# Patient Record
Sex: Female | Born: 1985 | Race: Black or African American | Hispanic: No | Marital: Married | Smoking: Never smoker
Health system: Southern US, Community
[De-identification: ages and names within clinical notes are randomized; demographics above are authoritative.]

## PROBLEM LIST (undated history)

## (undated) ENCOUNTER — Inpatient Hospital Stay (HOSPITAL_COMMUNITY): Payer: Self-pay

## (undated) DIAGNOSIS — R7611 Nonspecific reaction to tuberculin skin test without active tuberculosis: Secondary | ICD-10-CM

## (undated) DIAGNOSIS — B019 Varicella without complication: Secondary | ICD-10-CM

## (undated) DIAGNOSIS — R519 Headache, unspecified: Secondary | ICD-10-CM

## (undated) HISTORY — DX: Nonspecific reaction to tuberculin skin test without active tuberculosis: R76.11

## (undated) HISTORY — DX: Varicella without complication: B01.9

## (undated) HISTORY — DX: Headache, unspecified: R51.9

---

## 2008-04-27 HISTORY — PX: WRIST SURGERY: SHX841

## 2016-04-16 ENCOUNTER — Encounter (HOSPITAL_COMMUNITY): Payer: Self-pay

## 2016-04-16 ENCOUNTER — Inpatient Hospital Stay (HOSPITAL_COMMUNITY)
Admission: AD | Admit: 2016-04-16 | Discharge: 2016-04-16 | Disposition: A | Discharge status: Home / Self Care | Payer: Medicaid Other | Source: Ambulatory Visit | Attending: Obstetrics & Gynecology | Admitting: Obstetrics & Gynecology

## 2016-04-16 DIAGNOSIS — R079 Chest pain, unspecified: Secondary | ICD-10-CM | POA: Diagnosis present

## 2016-04-16 DIAGNOSIS — R109 Unspecified abdominal pain: Secondary | ICD-10-CM | POA: Diagnosis present

## 2016-04-16 DIAGNOSIS — R12 Heartburn: Secondary | ICD-10-CM | POA: Diagnosis not present

## 2016-04-16 DIAGNOSIS — O219 Vomiting of pregnancy, unspecified: Secondary | ICD-10-CM | POA: Diagnosis not present

## 2016-04-16 DIAGNOSIS — R1110 Vomiting, unspecified: Secondary | ICD-10-CM | POA: Diagnosis present

## 2016-04-16 DIAGNOSIS — Z3A01 Less than 8 weeks gestation of pregnancy: Secondary | ICD-10-CM | POA: Diagnosis not present

## 2016-04-16 DIAGNOSIS — R112 Nausea with vomiting, unspecified: Secondary | ICD-10-CM | POA: Diagnosis not present

## 2016-04-16 DIAGNOSIS — O26891 Other specified pregnancy related conditions, first trimester: Secondary | ICD-10-CM | POA: Insufficient documentation

## 2016-04-16 LAB — POCT PREGNANCY, URINE: Preg Test, Ur: POSITIVE — AB

## 2016-04-16 LAB — URINALYSIS, ROUTINE W REFLEX MICROSCOPIC
Bilirubin Urine: NEGATIVE
Glucose, UA: NEGATIVE mg/dL
Hgb urine dipstick: NEGATIVE
KETONES UR: NEGATIVE mg/dL
LEUKOCYTES UA: NEGATIVE
NITRITE: NEGATIVE
PROTEIN: NEGATIVE mg/dL
Specific Gravity, Urine: 1.005 (ref 1.005–1.030)
pH: 6 (ref 5.0–8.0)

## 2016-04-16 MED ORDER — GI COCKTAIL ~~LOC~~
30.0000 mL | Freq: Once | ORAL | Status: AC
  Administered 2016-04-16: 30 mL via ORAL
  Filled 2016-04-16: qty 30

## 2016-04-16 MED ORDER — PROMETHAZINE HCL 25 MG PO TABS: 25.0000 mg | ORAL_TABLET | Freq: Four times a day (QID) | ORAL | 1 refills | Status: DC | PRN

## 2016-04-16 MED ORDER — PROMETHAZINE HCL 25 MG PO TABS
25.0000 mg | ORAL_TABLET | Freq: Once | ORAL | Status: AC
Start: 2016-04-16 — End: 2016-04-16
  Administered 2016-04-16: 25 mg via ORAL
  Filled 2016-04-16: qty 1

## 2016-04-16 NOTE — Discharge Instructions (Signed)
Heartburn During Pregnancy Heartburn is pain or discomfort in the throat or chest. It may cause a burning feeling. It happens when stomach acid moves up into the tube that carries food from your mouth to your stomach (esophagus). Heartburn is common during pregnancy. It usually goes away or gets better after giving birth. Follow these instructions at home: Eating and drinking  Do not drink alcohol while you are pregnant.  Figure out which foods and beverages make you feel worse, and avoid them.  Beverages that you may want to avoid include: ? Coffee and tea (with or without caffeine). ? Energy drinks and sports drinks. ? Bubbly (carbonated) drinks or sodas. ? Citrus fruit juices.  Foods that you may want to avoid include: ? Chocolate and cocoa. ? Peppermint and mint flavorings. ? Garlic, onions, and horseradish. ? Spicy and acidic foods. These include peppers, chili powder, curry powder, vinegar, hot sauces, and barbecue sauce. ? Citrus fruits, such as oranges, lemons, and limes. ? Tomato-based foods, such as red sauce, chili, and salsa. ? Fried and fatty foods, such as donuts, french fries, potato chips, and high-fat dressings. ? High-fat meats, such as hot dogs, cold cuts, sausage, ham, and bacon. ? High-fat dairy items, such as whole milk, butter, and cheese.  Eat small meals often, instead of large meals.  Avoid drinking a lot of liquid with your meals.  Avoid eating meals during the 2-3 hours before you go to bed.  Avoid lying down right after you eat.  Do not exercise right after you eat. Medicines  Take over-the-counter and prescription medicines only as told by your doctor.  Do not take aspirin, ibuprofen, or other NSAIDs unless your doctor tells you to do that.  Your doctor may tell you to avoid medicines that have sodium bicarbonate in them. General instructions  If told, raise the head of your bed about 6 inches (15 cm). You can do this by putting blocks under  the legs. Sleeping with more pillows does not help with heartburn.  Do not use any products that contain nicotine or tobacco, such as cigarettes and e-cigarettes. If you need help quitting, ask your doctor.  Wear loose-fitting clothing.  Try to lower your stress, such as with yoga or meditation. If you need help, ask your doctor.  Stay at a healthy weight. If you are overweight, work with your doctor to safely lose weight.  Keep all follow-up visits as told by your doctor. This is important. Contact a doctor if:  You get new symptoms.  Your symptoms do not get better with treatment.  You have weight loss and you do not know why.  You have trouble swallowing.  You make loud sounds when you breathe (wheeze).  You have a cough that does not go away.  You have heartburn often for more than 2 weeks.  You feel sick to your stomach (nauseous), and this does not get better with treatment.  You are throwing up (vomiting), and this does not get better with treatment.  You have pain in your belly (abdomen). Get help right away if:  You have very bad chest pain that spreads to your arm, neck, or jaw.  You feel sweaty, dizzy, or light-headed.  You have trouble breathing.  You have pain when swallowing.  You throw up and your throw-up looks like blood or coffee grounds.  Your poop (stool) is bloody or black. This information is not intended to replace advice given to you by your health care provider.   Make sure you discuss any questions you have with your health care provider. Document Released: 05/16/2010 Document Revised: 12/30/2015 Document Reviewed: 12/30/2015 Elsevier Interactive Patient Education  2017 Elsevier Inc.  

## 2016-04-16 NOTE — MAU Provider Note (Signed)
History    Patient Nichole Perkins is a 30 year old G3P2002 at 6 weeks 3 days by reliable LMP. She has recently arrived from [place] with her family.   CSN: [id number]  Arrival date and time: 04/16/16 1407   None     Chief Complaint  Patient presents with  . Chest Pain  . Abdominal Pain  . Emesis   Emesis   This is a new problem. The current episode started in the past 7 days. The problem occurs less than 2 times per day. The problem has been gradually worsening. The emesis has an appearance of bile and bright red blood. Associated symptoms include abdominal pain and chest pain. Pertinent negatives include no arthralgias, chills, coughing, diarrhea, dizziness, fever, headaches, myalgias, sweats, URI or weight loss. She has tried nothing for the symptoms.  Abdominal Pain  This is a chronic problem. The current episode started 1 to 4 weeks ago. The onset quality is gradual. The problem occurs intermittently. The problem has been unchanged. The pain is located in the epigastric region. The quality of the pain is burning, aching and cramping. Associated symptoms include nausea and vomiting. Pertinent negatives include no arthralgias, diarrhea, fever, headaches, myalgias or weight loss. Nothing aggravates the pain. The pain is relieved by nothing. She has tried nothing for the symptoms.  Abdominal pain seems to be made worse by clenching and vomiting.   OB History    Gravida Para Term Preterm AB Living   3         2   SAB TAB Ectopic Multiple Live Births                  History reviewed. No pertinent past medical history.  Past Surgical History:  Procedure Laterality Date  . CESAREAN SECTION      No family history on file.  Social History  Substance Use Topics  . Smoking status: Never Smoker  . Smokeless tobacco: Never Used  . Alcohol use No    Allergies: No Known Allergies  No prescriptions prior to admission.    Review of Systems  Constitutional: Negative for chills,  fever and weight loss.  HENT: Negative.   Eyes: Negative.   Respiratory: Negative for cough.   Cardiovascular: Positive for chest pain.  Gastrointestinal: Positive for abdominal pain, heartburn, nausea and vomiting. Negative for diarrhea.  Genitourinary: Negative.   Musculoskeletal: Negative for arthralgias and myalgias.  Neurological: Negative.  Negative for dizziness and headaches.  Endo/Heme/Allergies: Negative.    Physical Exam   Blood pressure 143/65, pulse 76, temperature 98.7 F (37.1 C), temperature source Oral, resp. rate 17, height 5\' 4"  (1.626 m), weight 186 lb 8 oz (84.6 kg), last menstrual period 03/02/2016, SpO2 100 %.  Physical Exam  Constitutional: She is oriented to person, place, and time. She appears well-developed.  HENT:  Head: Normocephalic.  Neck: Normal range of motion.  Respiratory: Effort normal. No respiratory distress. She has no wheezes. She has no rales. She exhibits no tenderness.  GI: Soft. Bowel sounds are normal. She exhibits no distension and no mass. There is no tenderness. There is no rebound and no guarding.  Musculoskeletal: Normal range of motion.  Neurological: She is alert and oriented to person, place, and time.  Skin: Skin is warm.    MAU Course  Procedures  MDM -EKG-normal sinus rhythm -GI Cocktail and phenergan Assessment and Plan    1. Heartburn during pregnancy in first trimester   G3P2002 at 6 weeks and 3  days by reliable LMP here with complains of nausea, vomiting, abdominal pain and heartburn.   2. Discharge home with comfort measures, educated about eating small meals and separating fluid consumption with food intake. Recommend zantac PRN for heartburn and Phenergan RX for nausea.  3. [facility]  Kathryn Lorraine Kooistra CNM 04/16/2016, 3:06 PM

## 2016-04-16 NOTE — MAU Note (Signed)
Pt c/o chest pain and upper back pain that started 4 days ago. Pt states the pain comes and goes and is sometimes worse than others. Pt states she is also having upper mid abdominal that started before the chest pain. Pt states she took a pregnancy test at home that was positive. Pt denies vaginal bleeding and discharge.

## 2016-07-08 ENCOUNTER — Encounter: Payer: Self-pay | Admitting: Obstetrics and Gynecology

## 2016-07-08 ENCOUNTER — Ambulatory Visit (INDEPENDENT_AMBULATORY_CARE_PROVIDER_SITE_OTHER): Payer: BLUE CROSS/BLUE SHIELD | Admitting: Obstetrics and Gynecology

## 2016-07-08 ENCOUNTER — Other Ambulatory Visit (HOSPITAL_COMMUNITY)
Admission: RE | Admit: 2016-07-08 | Discharge: 2016-07-08 | Disposition: A | Discharge status: Home / Self Care | Payer: BLUE CROSS/BLUE SHIELD | Source: Ambulatory Visit | Attending: Obstetrics and Gynecology | Admitting: Obstetrics and Gynecology

## 2016-07-08 DIAGNOSIS — Z1151 Encounter for screening for human papillomavirus (HPV): Secondary | ICD-10-CM | POA: Diagnosis present

## 2016-07-08 DIAGNOSIS — O0930 Supervision of pregnancy with insufficient antenatal care, unspecified trimester: Secondary | ICD-10-CM | POA: Insufficient documentation

## 2016-07-08 DIAGNOSIS — Z3490 Encounter for supervision of normal pregnancy, unspecified, unspecified trimester: Secondary | ICD-10-CM | POA: Insufficient documentation

## 2016-07-08 DIAGNOSIS — Z113 Encounter for screening for infections with a predominantly sexual mode of transmission: Secondary | ICD-10-CM

## 2016-07-08 DIAGNOSIS — Z01419 Encounter for gynecological examination (general) (routine) without abnormal findings: Secondary | ICD-10-CM | POA: Diagnosis present

## 2016-07-08 DIAGNOSIS — Z124 Encounter for screening for malignant neoplasm of cervix: Secondary | ICD-10-CM | POA: Diagnosis not present

## 2016-07-08 DIAGNOSIS — Z3482 Encounter for supervision of other normal pregnancy, second trimester: Secondary | ICD-10-CM

## 2016-07-08 DIAGNOSIS — O34219 Maternal care for unspecified type scar from previous cesarean delivery: Secondary | ICD-10-CM | POA: Insufficient documentation

## 2016-07-08 DIAGNOSIS — O0932 Supervision of pregnancy with insufficient antenatal care, second trimester: Secondary | ICD-10-CM

## 2016-07-08 LAB — POCT URINALYSIS DIP (DEVICE)
BILIRUBIN URINE: NEGATIVE
Glucose, UA: NEGATIVE mg/dL
HGB URINE DIPSTICK: NEGATIVE
KETONES UR: NEGATIVE mg/dL
Leukocytes, UA: NEGATIVE
NITRITE: NEGATIVE
PH: 6 (ref 5.0–8.0)
PROTEIN: NEGATIVE mg/dL
Specific Gravity, Urine: >=1.03 (ref 1.005–1.030)
Urobilinogen, UA: 0.2 mg/dL (ref 0.0–1.0)

## 2016-07-08 NOTE — Patient Instructions (Signed)
Contraception Choices Contraception (birth control) is the use of any methods or devices to prevent pregnancy. Below are some methods to help avoid pregnancy. Hormonal methods  Contraceptive implant. This is a thin, plastic tube containing progesterone hormone. It does not contain estrogen hormone. Your health care provider inserts the tube in the inner part of the upper arm. The tube can remain in place for up to 3 years. After 3 years, the implant must be removed. The implant prevents the ovaries from releasing an egg (ovulation), thickens the cervical mucus to prevent sperm from entering the uterus, and thins the lining of the inside of the uterus.  Progesterone-only injections. These injections are given every 3 months by your health care provider to prevent pregnancy. This synthetic progesterone hormone stops the ovaries from releasing eggs. It also thickens cervical mucus and changes the uterine lining. This makes it harder for sperm to survive in the uterus.  Birth control pills. These pills contain estrogen and progesterone hormone. They work by preventing the ovaries from releasing eggs (ovulation). They also cause the cervical mucus to thicken, preventing the sperm from entering the uterus. Birth control pills are prescribed by a health care provider.Birth control pills can also be used to treat heavy periods.  Minipill. This type of birth control pill contains only the progesterone hormone. They are taken every day of each month and must be prescribed by your health care provider.  Birth control patch. The patch contains hormones similar to those in birth control pills. It must be changed once a week and is prescribed by a health care provider.  Vaginal ring. The ring contains hormones similar to those in birth control pills. It is left in the vagina for 3 weeks, removed for 1 week, and then a new one is put back in place. The patient must be comfortable inserting and removing the ring from  the vagina.A health care provider's prescription is necessary.  Emergency contraception. Emergency contraceptives prevent pregnancy after unprotected sexual intercourse. This pill can be taken right after sex or up to 5 days after unprotected sex. It is most effective the sooner you take the pills after having sexual intercourse. Most emergency contraceptive pills are available without a prescription. Check with your pharmacist. Do not use emergency contraception as your only form of birth control. Barrier methods  Female condom. This is a thin sheath (latex or rubber) that is worn over the penis during sexual intercourse. It can be used with spermicide to increase effectiveness.  Female condom. This is a soft, loose-fitting sheath that is put into the vagina before sexual intercourse.  Diaphragm. This is a soft, latex, dome-shaped barrier that must be fitted by a health care provider. It is inserted into the vagina, along with a spermicidal jelly. It is inserted before intercourse. The diaphragm should be left in the vagina for 6 to 8 hours after intercourse.  Cervical cap. This is a round, soft, latex or plastic cup that fits over the cervix and must be fitted by a health care provider. The cap can be left in place for up to 48 hours after intercourse.  Sponge. This is a soft, circular piece of polyurethane foam. The sponge has spermicide in it. It is inserted into the vagina after wetting it and before sexual intercourse.  Spermicides. These are chemicals that kill or block sperm from entering the cervix and uterus. They come in the form of creams, jellies, suppositories, foam, or tablets. They do not require a prescription. They   are inserted into the vagina with an applicator before having sexual intercourse. The process must be repeated every time you have sexual intercourse. Intrauterine contraception  Intrauterine device (IUD). This is a T-shaped device that is put in a woman's uterus during  a menstrual period to prevent pregnancy. There are 2 types: ? Copper IUD. This type of IUD is wrapped in copper wire and is placed inside the uterus. Copper makes the uterus and fallopian tubes produce a fluid that kills sperm. It can stay in place for 10 years. ? Hormone IUD. This type of IUD contains the hormone progestin (synthetic progesterone). The hormone thickens the cervical mucus and prevents sperm from entering the uterus, and it also thins the uterine lining to prevent implantation of a fertilized egg. The hormone can weaken or kill the sperm that get into the uterus. It can stay in place for 3-5 years, depending on which type of IUD is used. Permanent methods of contraception  Female tubal ligation. This is when the woman's fallopian tubes are surgically sealed, tied, or blocked to prevent the egg from traveling to the uterus.  Hysteroscopic sterilization. This involves placing a small coil or insert into each fallopian tube. Your doctor uses a technique called hysteroscopy to do the procedure. The device causes scar tissue to form. This results in permanent blockage of the fallopian tubes, so the sperm cannot fertilize the egg. It takes about 3 months after the procedure for the tubes to become blocked. You must use another form of birth control for these 3 months.  Female sterilization. This is when the female has the tubes that carry sperm tied off (vasectomy).This blocks sperm from entering the vagina during sexual intercourse. After the procedure, the man can still ejaculate fluid (semen). Natural planning methods  Natural family planning. This is not having sexual intercourse or using a barrier method (condom, diaphragm, cervical cap) on days the woman could become pregnant.  Calendar method. This is keeping track of the length of each menstrual cycle and identifying when you are fertile.  Ovulation method. This is avoiding sexual intercourse during ovulation.  Symptothermal method.  This is avoiding sexual intercourse during ovulation, using a thermometer and ovulation symptoms.  Post-ovulation method. This is timing sexual intercourse after you have ovulated. Regardless of which type or method of contraception you choose, it is important that you use condoms to protect against the transmission of sexually transmitted infections (STIs). Talk with your health care provider about which form of contraception is most appropriate for you. This information is not intended to replace advice given to you by your health care provider. Make sure you discuss any questions you have with your health care provider. Document Released: 04/13/2005 Document Revised: 09/19/2015 Document Reviewed: 10/06/2012 Elsevier Interactive Patient Education  2017 Elsevier Inc.  

## 2016-07-08 NOTE — Progress Notes (Signed)
   PRENATAL VISIT NOTE  Subjective:  Nichole Perkins is a 31 y.o. [phone] at 7980w2d being seen today for her first prenatal care.  She is currently monitored for the following issues for this low-risk pregnancy and has Supervision of low-risk pregnancy; Late prenatal care affecting pregnancy, antepartum; and Previous cesarean section complicating pregnancy on her problem list. Patient here from [place]. Moved her last November. Speaks english.   Patient reports no complaints.  Contractions: Not present. Vag. Bleeding: None, Other.  Movement: Present. Denies leaking of fluid.   The following portions of the patient's history were reviewed and updated as appropriate: allergies, current medications, past family history, past medical history, past social history, past surgical history and problem list. Problem list updated.  Objective:   Vitals:   07/08/16 0927  BP: (!) 123/56  Pulse: 72  Weight: 186 lb 1.6 oz (84.4 kg)    Fetal Status: Fetal Heart Rate (bpm): 150   Movement: Present     General:  Alert, oriented and cooperative. Patient is in no acute distress.  Skin: Skin is warm and dry. No rash noted.   Cardiovascular: Normal heart rate noted  Respiratory: Normal respiratory effort, no problems with respiration noted  Abdomen: Soft, gravid, appropriate for gestational age. Pain/Pressure: Absent     Pelvic:  Cervical exam deferred        Extremities: Normal range of motion.  Edema: None  Mental Status: Normal mood and affect. Normal behavior. Normal judgment and thought content.   Assessment and Plan:  Pregnancy: [id number] at 3980w2d  1. Encounter for supervision of low-risk pregnancy, antepartum  - Prenatal Profile I - Hemoglobinopathy Evaluation - Cytology - PAP - Culture, OB Urine - US MFM OB COMP + 14 WK; Future - Hemoglobin A1C - AFP, Quad Screen - Cervicovaginal ancillary only  2. Late prenatal care affecting pregnancy, antepartum   3. Previous cesarean section complicating  pregnancy  - Cervicovaginal ancillary only - TOLAC requested   Initial labs drawn. Continue prenatal vitamins. Genetic Screening discussed, First trimester screen, Quad screen and NIPS: Quad screen requested. Ultrasound discussed Problem list reviewed and updated. The nature of [facility] with multiple MDs and other Advanced Practice Providers was explained to patient; also emphasized that residents, students are part of our team. Routine obstetric precautions reviewed.  Preterm labor symptoms and general obstetric precautions including but not limited to vaginal bleeding, contractions, leaking of fluid and fetal movement were reviewed in detail with the patient. Please refer to After Visit Summary for other counseling recommendations.  Return in about 4 weeks (around 08/05/2016).   Duane LopeJennifer I Benson Porcaro, NP

## 2016-07-08 NOTE — Progress Notes (Signed)
Here for initial prenatal visit. Needs 2 hr fasting gtt due to bmi )30. Has eaten today- will make appt for lab only for gtt when checks out today. States had spotting twice earlier in pregnancy- none since then. Declines flu vaccine.

## 2016-07-09 LAB — CERVICOVAGINAL ANCILLARY ONLY
BACTERIAL VAGINITIS: NEGATIVE
CHLAMYDIA, DNA PROBE: NEGATIVE
Candida vaginitis: POSITIVE — AB
NEISSERIA GONORRHEA: NEGATIVE
Trichomonas: NEGATIVE

## 2016-07-10 LAB — HEMOGLOBINOPATHY EVALUATION
FERRITIN: 68 ng/mL (ref 15–150)
HGB A2 QUANT: 2.5 % (ref 1.8–3.2)
HGB C: 0 %
Hgb A: 95.6 % — ABNORMAL LOW (ref 96.4–98.8)
Hgb F Quant: 1.9 % (ref 0.0–2.0)
Hgb S: 0 %
Hgb Solubility: NEGATIVE
Hgb Variant: 0 %

## 2016-07-10 LAB — HEMOGLOBIN A1C
Est. average glucose Bld gHb Est-mCnc: 100 mg/dL
HEMOGLOBIN A1C: 5.1 % (ref 4.8–5.6)

## 2016-07-10 LAB — PRENATAL PROFILE I(LABCORP)
ANTIBODY SCREEN: NEGATIVE
Basophils Absolute: 0 10*3/uL (ref 0.0–0.2)
Basos: 0 %
EOS (ABSOLUTE): 0.1 10*3/uL (ref 0.0–0.4)
EOS: 2 %
Hematocrit: 33.5 % — ABNORMAL LOW (ref 34.0–46.6)
Hemoglobin: 11 g/dL — ABNORMAL LOW (ref 11.1–15.9)
Hepatitis B Surface Ag: NEGATIVE
Immature Grans (Abs): 0 10*3/uL (ref 0.0–0.1)
Immature Granulocytes: 0 %
LYMPHS ABS: 1.4 10*3/uL (ref 0.7–3.1)
Lymphs: 26 %
MCH: 30.5 pg (ref 26.6–33.0)
MCHC: 32.8 g/dL (ref 31.5–35.7)
MCV: 93 fL (ref 79–97)
Monocytes Absolute: 0.3 10*3/uL (ref 0.1–0.9)
Monocytes: 6 %
NEUTROS ABS: 3.5 10*3/uL (ref 1.4–7.0)
Neutrophils: 66 %
PLATELETS: 205 10*3/uL (ref 150–379)
RBC: 3.61 x10E6/uL — AB (ref 3.77–5.28)
RDW: 14.2 % (ref 12.3–15.4)
RH TYPE: POSITIVE
RPR: NONREACTIVE
Rubella Antibodies, IGG: 1.48 index (ref >0.99)
WBC: 5.3 10*3/uL (ref 3.4–10.8)

## 2016-07-10 LAB — CYTOLOGY - PAP
Diagnosis: NEGATIVE
HPV: NOT DETECTED

## 2016-07-13 ENCOUNTER — Telehealth: Payer: Self-pay | Admitting: Obstetrics and Gynecology

## 2016-07-13 ENCOUNTER — Other Ambulatory Visit: Payer: BLUE CROSS/BLUE SHIELD

## 2016-07-13 DIAGNOSIS — Z3401 Encounter for supervision of normal first pregnancy, first trimester: Secondary | ICD-10-CM

## 2016-07-13 LAB — CULTURE, OB URINE

## 2016-07-13 LAB — URINE CULTURE, OB REFLEX

## 2016-07-13 MED ORDER — PRENATAL VITAMINS 28-0.8 MG PO TABS: 1.0000 | ORAL_TABLET | Freq: Every day | ORAL | 3 refills | Status: DC

## 2016-07-13 MED ORDER — TERCONAZOLE 0.4 % VA CREA: 1.0000 | TOPICAL_CREAM | Freq: Every day | VAGINAL | 0 refills | Status: DC

## 2016-07-13 NOTE — Telephone Encounter (Signed)
+   yeast vaginitis  Terazol called into pharmacy.

## 2016-07-14 LAB — GLUCOSE TOLERANCE, 2 HOURS W/ 1HR
GLUCOSE, 1 HOUR: 115 mg/dL (ref 65–179)
GLUCOSE, FASTING: 90 mg/dL (ref 65–91)
Glucose, 2 hour: 116 mg/dL (ref 65–152)

## 2016-07-15 ENCOUNTER — Ambulatory Visit (HOSPITAL_COMMUNITY)
Admission: RE | Admit: 2016-07-15 | Discharge: 2016-07-15 | Disposition: A | Discharge status: Home / Self Care | Payer: BLUE CROSS/BLUE SHIELD | Source: Ambulatory Visit | Attending: Obstetrics and Gynecology | Admitting: Obstetrics and Gynecology

## 2016-07-15 DIAGNOSIS — Z363 Encounter for antenatal screening for malformations: Secondary | ICD-10-CM | POA: Diagnosis not present

## 2016-07-15 DIAGNOSIS — Z3490 Encounter for supervision of normal pregnancy, unspecified, unspecified trimester: Secondary | ICD-10-CM

## 2016-07-15 DIAGNOSIS — Z3A19 19 weeks gestation of pregnancy: Secondary | ICD-10-CM | POA: Diagnosis not present

## 2016-07-15 DIAGNOSIS — O34211 Maternal care for low transverse scar from previous cesarean delivery: Secondary | ICD-10-CM | POA: Insufficient documentation

## 2016-07-21 LAB — AFP, QUAD SCREEN
DIA MOM VALUE: 1.51
DIA VALUE (EIA): 234.96 pg/mL
DSR (By Age)    1 IN: 485
DSR (Second Trimester) 1 IN: 2519
Gestational Age: 18.3 WEEKS
MSAFP Mom: 1.25
MSAFP: 53.8 ng/mL
MSHCG MOM: 1.39
MSHCG: 32080 m[IU]/mL
Maternal Age At EDD: 32.5 YEARS
Osb Risk: 10000
TEST RESULTS AFP: NEGATIVE
UE3 MOM: 1.46
UE3 VALUE: 1.94 ng/mL
Weight: 186 [lb_av]

## 2016-07-29 ENCOUNTER — Encounter: Payer: BLUE CROSS/BLUE SHIELD | Admitting: Obstetrics and Gynecology

## 2016-07-29 ENCOUNTER — Ambulatory Visit (INDEPENDENT_AMBULATORY_CARE_PROVIDER_SITE_OTHER): Payer: BLUE CROSS/BLUE SHIELD | Admitting: Obstetrics and Gynecology

## 2016-07-29 DIAGNOSIS — Z3490 Encounter for supervision of normal pregnancy, unspecified, unspecified trimester: Secondary | ICD-10-CM

## 2016-07-29 DIAGNOSIS — Z3483 Encounter for supervision of other normal pregnancy, third trimester: Secondary | ICD-10-CM

## 2016-07-29 DIAGNOSIS — O34219 Maternal care for unspecified type scar from previous cesarean delivery: Secondary | ICD-10-CM

## 2016-07-29 NOTE — Progress Notes (Signed)
   PRENATAL VISIT NOTE  Subjective:  Revia Nghiem is a 31 y.o. [phone] at [redacted]w[redacted]d being seen today for ongoing prenatal care.  She is currently monitored for the following issues for this low-risk pregnancy and has Supervision of low-risk pregnancy; Late prenatal care affecting pregnancy, antepartum; and Previous cesarean section complicating pregnancy on her problem list.  Patient reports no complaints.   . Vag. Bleeding: None.  Movement: Present. Denies leaking of fluid.   The following portions of the patient's history were reviewed and updated as appropriate: allergies, current medications, past family history, past medical history, past social history, past surgical history and problem list. Problem list updated.  Objective:   Vitals:   07/29/16 1115  BP: (!) 113/47  Pulse: 86  Weight: 191 lb 14.4 oz (87 kg)    Fetal Status: Fetal Heart Rate (bpm): 160   Movement: Present     General:  Alert, oriented and cooperative. Patient is in no acute distress.  Skin: Skin is warm and dry. No rash noted.   Cardiovascular: Normal heart rate noted  Respiratory: Normal respiratory effort, no problems with respiration noted  Abdomen: Soft, gravid, appropriate for gestational age. Pain/Pressure: Absent     Pelvic:  Cervical exam deferred        Extremities: Normal range of motion.  Edema: None  Mental Status: Normal mood and affect. Normal behavior. Normal judgment and thought content.   Assessment and Plan:  Pregnancy: [id number] at [redacted]w[redacted]d  1. Encounter for supervision of low-risk pregnancy, antepartum Reviewed PN lab and [place] results.    2. Previous Cesarean Section x2 Desires TOLAC> counseled and consent form given to review and bring next  Preterm labor symptoms and general obstetric precautions including but not limited to vaginal bleeding, contractions, leaking of fluid and fetal movement were reviewed in detail with the patient. Please refer to After Visit Summary for other counseling  recommendations.  Return in about 4 weeks (around 08/26/2016).   Danae Orleans, CNM

## 2016-07-29 NOTE — Patient Instructions (Signed)

## 2016-08-26 ENCOUNTER — Ambulatory Visit (INDEPENDENT_AMBULATORY_CARE_PROVIDER_SITE_OTHER): Payer: BLUE CROSS/BLUE SHIELD | Admitting: Student

## 2016-08-26 DIAGNOSIS — O34219 Maternal care for unspecified type scar from previous cesarean delivery: Secondary | ICD-10-CM

## 2016-08-26 DIAGNOSIS — Z3492 Encounter for supervision of normal pregnancy, unspecified, second trimester: Secondary | ICD-10-CM

## 2016-08-26 DIAGNOSIS — O34212 Maternal care for vertical scar from previous cesarean delivery: Secondary | ICD-10-CM

## 2016-08-26 DIAGNOSIS — Z3490 Encounter for supervision of normal pregnancy, unspecified, unspecified trimester: Secondary | ICD-10-CM

## 2016-08-26 NOTE — Patient Instructions (Signed)
AREA PEDIATRIC/FAMILY PRACTICE PHYSICIANS  [facility] [address], [address] [place], [place]  [address] Phone - [phone]   Fax - [phone]  [facility] [address] [place], [place] [address] Phone - [phone]   Fax - [phone]  [facility] [address] [place], [place]  [address] Phone - [phone]   Fax - [phone]  [facility] [address] [place], [place]  [address] Phone - [phone]   Fax - [phone]  [facility] [address] [place], [place]  [address] Phone - [phone]   Fax - [phone]  [facility] [address] [place], [place]  [address] Phone - [phone]   Fax - [phone]  [facility] [address] [place], [place]  [address] Phone - [phone]   Fax - [phone]  [facility] [address] [place], [place]  [address] Phone - [phone]   Fax - [phone]  [facility] [address] [place], [place]  [address] Phone - [phone]   Fax - [phone] [facility] [address] [place], [place]  [address] Phone - [phone]   Fax - [phone]  [facility] [address] [place], [place]  [address] Phone - [phone]   Fax - [phone]  [facility] [address] [place], [place]  [address] Phone - [phone]   Fax - [phone]  [facility] [address], [address] [place], [place]  [address] Phone - [phone]   Fax - [phone]  [facility] [address] [place], [place]  [address] Phone - [phone]  [facility] [address] [place], [place]  [address] Phone - [phone]   Fax - [phone]  [facility] [address] [place], [place]  [address] Phone - [phone]   Fax - [phone]  [facility] [address] [place], [place]  [address] Phone - [phone]   Fax - [phone]  [facility] [address] [place], [place]  [address] Phone - [phone]   Fax - [phone]   [facility] [address] [place], [place]  [address] Phone - [phone]   Fax - [phone]  [facility] [address] [place], [place]  [address] Phone - [phone]   Fax - [phone]  [facility] [address] [place], [place]  [address] Phone - [phone]   Fax - [phone]  [facility] [address] , [place]  [address] Phone - [phone]   Fax - [phone]  [facility] [address] , [place]  [address] Phone - [phone]   Fax - [phone] [facility] [address] [place], [place]  [address] Phone - [phone]   Fax - [phone]   [facility] [address] [place], [place]  [address] Phone - [phone]   Fax - [phone]   [facility] [address] [place], [place]  [address] Phone - [phone]   Fax - [phone]  [facility] Charlene Flemming MD [address] [place] [address] Phone [phone]  Fax [phone]   

## 2016-08-26 NOTE — Progress Notes (Addendum)
   PRENATAL VISIT NOTE  Subjective:  Nichole Perkins is a 31 y.o. [phone] at [redacted]w[redacted]d being seen today for ongoing prenatal care.  She is currently monitored for the following issues for this low-risk pregnancy and has Supervision of low-risk pregnancy; Late prenatal care affecting pregnancy, antepartum; and Previous cesarean section complicating pregnancy on her problem list.  Patient reports no complaints.  Contractions: Not present. Vag. Bleeding: None.  Movement: Present. Denies leaking of fluid.   The following portions of the patient's history were reviewed and updated as appropriate: allergies, current medications, past family history, past medical history, past social history, past surgical history and problem list. Problem list updated.  Objective:   Vitals:   08/26/16 1026  BP: (!) 85/43  Pulse: 82  Weight: 87.1 kg (192 lb)    Fetal Status: Fetal Heart Rate (bpm): 150 Fundal Height: 28 cm Movement: Present     General:  Alert, oriented and cooperative. Patient is in no acute distress.  Skin: Skin is warm and dry. No rash noted.   Cardiovascular: Normal heart rate noted  Respiratory: Normal respiratory effort, no problems with respiration noted  Abdomen: Soft, gravid, appropriate for gestational age. Pain/Pressure: Absent     Pelvic:  Cervical exam deferred        Extremities: Normal range of motion.  Edema: None  Mental Status: Normal mood and affect. Normal behavior. Normal judgment and thought content.   Assessment and Plan:  Pregnancy: [id number] at [redacted]w[redacted]d  1. Encounter for supervision of low-risk pregnancy, antepartum -Patient signed VBAC consent; desires TOLAC -Depression score negative - [place] MFM OB FOLLOW UP; Future - HIV antibody -GTT next visit -list of peds given Preterm labor symptoms and general obstetric precautions including but not limited to vaginal bleeding, contractions, leaking of fluid and fetal movement were reviewed in detail with the patient. Please refer  to After Visit Summary for other counseling recommendations.  Return in about 3 weeks (around 09/16/2016).   Marylene Land, CNM

## 2016-08-27 LAB — HIV ANTIBODY (ROUTINE TESTING W REFLEX): HIV Screen 4th Generation wRfx: NONREACTIVE

## 2016-09-08 ENCOUNTER — Ambulatory Visit (HOSPITAL_COMMUNITY)
Admission: RE | Admit: 2016-09-08 | Discharge: 2016-09-08 | Disposition: A | Discharge status: Home / Self Care | Payer: BLUE CROSS/BLUE SHIELD | Source: Ambulatory Visit | Attending: Student | Admitting: Student

## 2016-09-08 DIAGNOSIS — O0932 Supervision of pregnancy with insufficient antenatal care, second trimester: Secondary | ICD-10-CM | POA: Insufficient documentation

## 2016-09-08 DIAGNOSIS — O34219 Maternal care for unspecified type scar from previous cesarean delivery: Secondary | ICD-10-CM | POA: Diagnosis not present

## 2016-09-08 DIAGNOSIS — Z362 Encounter for other antenatal screening follow-up: Secondary | ICD-10-CM | POA: Insufficient documentation

## 2016-09-08 DIAGNOSIS — Z3A27 27 weeks gestation of pregnancy: Secondary | ICD-10-CM | POA: Insufficient documentation

## 2016-09-08 DIAGNOSIS — Z3490 Encounter for supervision of normal pregnancy, unspecified, unspecified trimester: Secondary | ICD-10-CM

## 2016-09-08 DIAGNOSIS — O99212 Obesity complicating pregnancy, second trimester: Secondary | ICD-10-CM | POA: Diagnosis not present

## 2016-09-10 ENCOUNTER — Ambulatory Visit (INDEPENDENT_AMBULATORY_CARE_PROVIDER_SITE_OTHER): Payer: BLUE CROSS/BLUE SHIELD | Admitting: Obstetrics & Gynecology

## 2016-09-10 VITALS — BP 102/53 | HR 76 | Wt 196.8 lb

## 2016-09-10 DIAGNOSIS — Z3493 Encounter for supervision of normal pregnancy, unspecified, third trimester: Secondary | ICD-10-CM

## 2016-09-10 DIAGNOSIS — Z23 Encounter for immunization: Secondary | ICD-10-CM

## 2016-09-10 NOTE — Progress Notes (Signed)
Breastfeeding tip reviewed 28 week labs tday

## 2016-09-10 NOTE — Patient Instructions (Signed)
Third Trimester of Pregnancy The third trimester is from week 28 through week 40 (months 7 through 9). The third trimester is a time when the unborn baby (fetus) is growing rapidly. At the end of the ninth month, the fetus is about 20 inches in length and weighs 6-10 pounds. Body changes during your third trimester Your body will continue to go through many changes during pregnancy. The changes vary from woman to woman. During the third trimester:  Your weight will continue to increase. You can expect to gain 25-35 pounds (11-16 kg) by the end of the pregnancy.  You may begin to get stretch marks on your hips, abdomen, and breasts.  You may urinate more often because the fetus is moving lower into your pelvis and pressing on your bladder.  You may develop or continue to have heartburn. This is caused by increased hormones that slow down muscles in the digestive tract.  You may develop or continue to have constipation because increased hormones slow digestion and cause the muscles that push waste through your intestines to relax.  You may develop hemorrhoids. These are swollen veins (varicose veins) in the rectum that can itch or be painful.  You may develop swollen, bulging veins (varicose veins) in your legs.  You may have increased body aches in the pelvis, back, or thighs. This is due to weight gain and increased hormones that are relaxing your joints.  You may have changes in your hair. These can include thickening of your hair, rapid growth, and changes in texture. Some women also have hair loss during or after pregnancy, or hair that feels dry or thin. Your hair will most likely return to normal after your baby is born.  Your breasts will continue to grow and they will continue to become tender. A yellow fluid (colostrum) may leak from your breasts. This is the first milk you are producing for your baby.  Your belly button may stick out.  You may notice more swelling in your hands,  face, or ankles.  You may have increased tingling or numbness in your hands, arms, and legs. The skin on your belly may also feel numb.  You may feel short of breath because of your expanding uterus.  You may have more problems sleeping. This can be caused by the size of your belly, increased need to urinate, and an increase in your body's metabolism.  You may notice the fetus "dropping," or moving lower in your abdomen (lightening).  You may have increased vaginal discharge.  You may notice your joints feel loose and you may have pain around your pelvic bone.  What to expect at prenatal visits You will have prenatal exams every 2 weeks until week 36. Then you will have weekly prenatal exams. During a routine prenatal visit:  You will be weighed to make sure you and the baby are growing normally.  Your blood pressure will be taken.  Your abdomen will be measured to track your baby's growth.  The fetal heartbeat will be listened to.  Any test results from the previous visit will be discussed.  You may have a cervical check near your due date to see if your cervix has softened or thinned (effaced).  You will be tested for Group B streptococcus. This happens between 35 and 37 weeks.  Your health care provider may ask you:  What your birth plan is.  How you are feeling.  If you are feeling the baby move.  If you have had   any abnormal symptoms, such as leaking fluid, bleeding, severe headaches, or abdominal cramping.  If you are using any tobacco products, including cigarettes, chewing tobacco, and electronic cigarettes.  If you have any questions.  Other tests or screenings that may be performed during your third trimester include:  Blood tests that check for low iron levels (anemia).  Fetal testing to check the health, activity level, and growth of the fetus. Testing is done if you have certain medical conditions or if there are problems during the  pregnancy.  Nonstress test (NST). This test checks the health of your baby to make sure there are no signs of problems, such as the baby not getting enough oxygen. During this test, a belt is placed around your belly. The baby is made to move, and its heart rate is monitored during movement.  What is false labor? False labor is a condition in which you feel small, irregular tightenings of the muscles in the womb (contractions) that usually go away with rest, changing position, or drinking water. These are called Braxton Hicks contractions. Contractions may last for hours, days, or even weeks before true labor sets in. If contractions come at regular intervals, become more frequent, increase in intensity, or become painful, you should see your health care provider. What are the signs of labor?  Abdominal cramps.  Regular contractions that start at 10 minutes apart and become stronger and more frequent with time.  Contractions that start on the top of the uterus and spread down to the lower abdomen and back.  Increased pelvic pressure and dull back pain.  A watery or bloody mucus discharge that comes from the vagina.  Leaking of amniotic fluid. This is also known as your "water breaking." It could be a slow trickle or a gush. Let your health care provider know if it has a color or strange odor. If you have any of these signs, call your health care provider right away, even if it is before your due date. Follow these instructions at home: Medicines  Follow your health care provider's instructions regarding medicine use. Specific medicines may be either safe or unsafe to take during pregnancy.  Take a prenatal vitamin that contains at least 600 micrograms (mcg) of folic acid.  If you develop constipation, try taking a stool softener if your health care provider approves. Eating and drinking  Eat a balanced diet that includes fresh fruits and vegetables, whole grains, good sources of protein  such as meat, eggs, or tofu, and low-fat dairy. Your health care provider will help you determine the amount of weight gain that is right for you.  Avoid raw meat and uncooked cheese. These carry germs that can cause birth defects in the baby.  If you have low calcium intake from food, talk to your health care provider about whether you should take a daily calcium supplement.  Eat four or five small meals rather than three large meals a day.  Limit foods that are high in fat and processed sugars, such as fried and sweet foods.  To prevent constipation: ? Drink enough fluid to keep your urine clear or pale yellow. ? Eat foods that are high in fiber, such as fresh fruits and vegetables, whole grains, and beans. Activity  Exercise only as directed by your health care provider. Most women can continue their usual exercise routine during pregnancy. Try to exercise for 30 minutes at least 5 days a week. Stop exercising if you experience uterine contractions.  Avoid heavy   lifting.  Do not exercise in extreme heat or humidity, or at high altitudes.  Wear low-heel, comfortable shoes.  Practice good posture.  You may continue to have sex unless your health care provider tells you otherwise. Relieving pain and discomfort  Take frequent breaks and rest with your legs elevated if you have leg cramps or low back pain.  Take warm sitz baths to soothe any pain or discomfort caused by hemorrhoids. Use hemorrhoid cream if your health care provider approves.  Wear a good support bra to prevent discomfort from breast tenderness.  If you develop varicose veins: ? Wear support pantyhose or compression stockings as told by your healthcare provider. ? Elevate your feet for 15 minutes, 3-4 times a day. Prenatal care  Write down your questions. Take them to your prenatal visits.  Keep all your prenatal visits as told by your health care provider. This is important. Safety  Wear your seat belt at  all times when driving.  Make a list of emergency phone numbers, including numbers for family, friends, the hospital, and police and fire departments. General instructions  Avoid cat litter boxes and soil used by cats. These carry germs that can cause birth defects in the baby. If you have a cat, ask someone to clean the litter box for you.  Do not travel far distances unless it is absolutely necessary and only with the approval of your health care provider.  Do not use hot tubs, steam rooms, or saunas.  Do not drink alcohol.  Do not use any products that contain nicotine or tobacco, such as cigarettes and e-cigarettes. If you need help quitting, ask your health care provider.  Do not use any medicinal herbs or unprescribed drugs. These chemicals affect the formation and growth of the baby.  Do not douche or use tampons or scented sanitary pads.  Do not cross your legs for long periods of time.  To prepare for the arrival of your baby: ? Take prenatal classes to understand, practice, and ask questions about labor and delivery. ? Make a trial run to the hospital. ? Visit the hospital and tour the maternity area. ? Arrange for maternity or paternity leave through employers. ? Arrange for family and friends to take care of pets while you are in the hospital. ? Purchase a rear-facing car seat and make sure you know how to install it in your car. ? Pack your hospital bag. ? Prepare the baby's nursery. Make sure to remove all pillows and stuffed animals from the baby's crib to prevent suffocation.  Visit your dentist if you have not gone during your pregnancy. Use a soft toothbrush to brush your teeth and be gentle when you floss. Contact a health care provider if:  You are unsure if you are in labor or if your water has broken.  You become dizzy.  You have mild pelvic cramps, pelvic pressure, or nagging pain in your abdominal area.  You have lower back pain.  You have persistent  nausea, vomiting, or diarrhea.  You have an unusual or bad smelling vaginal discharge.  You have pain when you urinate. Get help right away if:  Your water breaks before 37 weeks.  You have regular contractions less than 5 minutes apart before 37 weeks.  You have a fever.  You are leaking fluid from your vagina.  You have spotting or bleeding from your vagina.  You have severe abdominal pain or cramping.  You have rapid weight loss or weight gain.    You have shortness of breath with chest pain.  You notice sudden or extreme swelling of your face, hands, ankles, feet, or legs.  Your baby makes fewer than 10 movements in 2 hours.  You have severe headaches that do not go away when you take medicine.  You have vision changes. Summary  The third trimester is from week 28 through week 40, months 7 through 9. The third trimester is a time when the unborn baby (fetus) is growing rapidly.  During the third trimester, your discomfort may increase as you and your baby continue to gain weight. You may have abdominal, leg, and back pain, sleeping problems, and an increased need to urinate.  During the third trimester your breasts will keep growing and they will continue to become tender. A yellow fluid (colostrum) may leak from your breasts. This is the first milk you are producing for your baby.  False labor is a condition in which you feel small, irregular tightenings of the muscles in the womb (contractions) that eventually go away. These are called Braxton Hicks contractions. Contractions may last for hours, days, or even weeks before true labor sets in.  Signs of labor can include: abdominal cramps; regular contractions that start at 10 minutes apart and become stronger and more frequent with time; watery or bloody mucus discharge that comes from the vagina; increased pelvic pressure and dull back pain; and leaking of amniotic fluid. This information is not intended to replace advice  given to you by your health care provider. Make sure you discuss any questions you have with your health care provider. Document Released: 04/07/2001 Document Revised: 09/19/2015 Document Reviewed: 06/14/2012 [employer] Interactive Patient Education  2017 [employer]  

## 2016-09-10 NOTE — Progress Notes (Signed)
   PRENATAL VISIT NOTE  Subjective:  Nichole Perkins is a 31 y.o. [phone] at 1553w3d being seen today for ongoing prenatal care.  She is currently monitored for the following issues for this low-risk pregnancy and has Supervision of low-risk pregnancy; Late prenatal care affecting pregnancy, antepartum; and Previous cesarean section complicating pregnancy on her problem list.  Patient reports no complaints.  Contractions: Irritability. Vag. Bleeding: None.  Movement: Present. Denies leaking of fluid.   The following portions of the patient's history were reviewed and updated as appropriate: allergies, current medications, past family history, past medical history, past social history, past surgical history and problem list. Problem list updated.  Objective:   Vitals:   09/10/16 0818  BP: (!) 102/53  Pulse: 76  Weight: 196 lb 12.8 oz (89.3 kg)    Fetal Status: Fetal Heart Rate (bpm): 154 Fundal Height: 29 cm Movement: Present     General:  Alert, oriented and cooperative. Patient is in no acute distress.  Skin: Skin is warm and dry. No rash noted.   Cardiovascular: Normal heart rate noted  Respiratory: Normal respiratory effort, no problems with respiration noted  Abdomen: Soft, gravid, appropriate for gestational age. Pain/Pressure: Absent     Pelvic:  Cervical exam deferred        Extremities: Normal range of motion.  Edema: None  Mental Status: Normal mood and affect. Normal behavior. Normal judgment and thought content.   Assessment and Plan:  Pregnancy: [id number] at 2553w3d  1. Supervision of low-risk pregnancy, third trimester Third trimester labs today, will follow up results and manage accordingly. - Glucose Tolerance, 2 Hours w/1 Hour - CBC - RPR - HIV antibody (with reflex) - Tdap vaccine greater than or equal to 7yo IM Preterm labor symptoms and general obstetric precautions including but not limited to vaginal bleeding, contractions, leaking of fluid and fetal movement were  reviewed in detail with the patient. Please refer to After Visit Summary for other counseling recommendations.  Return in about 2 weeks (around 09/24/2016) for OB Visit (LOB).   Jaynie CollinsUgonna Amandalynn Pitz, MD

## 2016-09-11 LAB — GLUCOSE TOLERANCE, 2 HOURS W/ 1HR
GLUCOSE, 1 HOUR: 97 mg/dL (ref 65–179)
GLUCOSE, 2 HOUR: 115 mg/dL (ref 65–152)
GLUCOSE, FASTING: 85 mg/dL (ref 65–91)

## 2016-09-11 LAB — CBC
HEMOGLOBIN: 10.7 g/dL — AB (ref 11.1–15.9)
Hematocrit: 32.7 % — ABNORMAL LOW (ref 34.0–46.6)
MCH: 30.7 pg (ref 26.6–33.0)
MCHC: 32.7 g/dL (ref 31.5–35.7)
MCV: 94 fL (ref 79–97)
PLATELETS: 222 10*3/uL (ref 150–379)
RBC: 3.49 x10E6/uL — AB (ref 3.77–5.28)
RDW: 14 % (ref 12.3–15.4)
WBC: 4.5 10*3/uL (ref 3.4–10.8)

## 2016-09-11 LAB — HIV ANTIBODY (ROUTINE TESTING W REFLEX): HIV SCREEN 4TH GENERATION: NONREACTIVE

## 2016-09-11 LAB — RPR: RPR: NONREACTIVE

## 2016-09-28 ENCOUNTER — Ambulatory Visit (INDEPENDENT_AMBULATORY_CARE_PROVIDER_SITE_OTHER): Payer: BLUE CROSS/BLUE SHIELD | Admitting: Obstetrics & Gynecology

## 2016-09-28 DIAGNOSIS — Z3493 Encounter for supervision of normal pregnancy, unspecified, third trimester: Secondary | ICD-10-CM

## 2016-09-28 DIAGNOSIS — Z3483 Encounter for supervision of other normal pregnancy, third trimester: Secondary | ICD-10-CM

## 2016-09-28 NOTE — Patient Instructions (Signed)
Etonogestrel implant What is this medicine? ETONOGESTREL (et oh noe JES trel) is a contraceptive (birth control) device. It is used to prevent pregnancy. It can be used for up to 3 years. This medicine may be used for other purposes; ask your health care provider or pharmacist if you have questions. COMMON BRAND NAME(S): Implanon, Nexplanon What should I tell my health care provider before I take this medicine? They need to know if you have any of these conditions: -abnormal vaginal bleeding -blood vessel disease or blood clots -cancer of the breast, cervix, or liver -depression -diabetes -gallbladder disease -headaches -heart disease or recent heart attack -high blood pressure -high cholesterol -kidney disease -liver disease -renal disease -seizures -tobacco smoker -an unusual or allergic reaction to etonogestrel, other hormones, anesthetics or antiseptics, medicines, foods, dyes, or preservatives -pregnant or trying to get pregnant -breast-feeding How should I use this medicine? This device is inserted just under the skin on the inner side of your upper arm by a health care professional. Talk to your pediatrician regarding the use of this medicine in children. Special care may be needed. Overdosage: If you think you have taken too much of this medicine contact a poison control center or emergency room at once. NOTE: This medicine is only for you. Do not share this medicine with others. What if I miss a dose? This does not apply. What may interact with this medicine? Do not take this medicine with any of the following medications: -amprenavir -bosentan -fosamprenavir This medicine may also interact with the following medications: -barbiturate medicines for inducing sleep or treating seizures -certain medicines for fungal infections like ketoconazole and itraconazole -grapefruit juice -griseofulvin -medicines to treat seizures like carbamazepine, felbamate, oxcarbazepine,  phenytoin, topiramate -modafinil -phenylbutazone -rifampin -rufinamide -some medicines to treat HIV infection like atazanavir, indinavir, lopinavir, nelfinavir, tipranavir, ritonavir -St. John's wort This list may not describe all possible interactions. Give your health care provider a list of all the medicines, herbs, non-prescription drugs, or dietary supplements you use. Also tell them if you smoke, drink alcohol, or use illegal drugs. Some items may interact with your medicine. What should I watch for while using this medicine? This product does not protect you against HIV infection (AIDS) or other sexually transmitted diseases. You should be able to feel the implant by pressing your fingertips over the skin where it was inserted. Contact your doctor if you cannot feel the implant, and use a non-hormonal birth control method (such as condoms) until your doctor confirms that the implant is in place. If you feel that the implant may have broken or become bent while in your arm, contact your healthcare provider. What side effects may I notice from receiving this medicine? Side effects that you should report to your doctor or health care professional as soon as possible: -allergic reactions like skin rash, itching or hives, swelling of the face, lips, or tongue -breast lumps -changes in emotions or moods -depressed mood -heavy or prolonged menstrual bleeding -pain, irritation, swelling, or bruising at the insertion site -scar at site of insertion -signs of infection at the insertion site such as fever, and skin redness, pain or discharge -signs of pregnancy -signs and symptoms of a blood clot such as breathing problems; changes in vision; chest pain; severe, sudden headache; pain, swelling, warmth in the leg; trouble speaking; sudden numbness or weakness of the face, arm or leg -signs and symptoms of liver injury like dark yellow or brown urine; general ill feeling or flu-like symptoms;  light-colored   stools; loss of appetite; nausea; right upper belly pain; unusually weak or tired; yellowing of the eyes or skin -unusual vaginal bleeding, discharge -signs and symptoms of a stroke like changes in vision; confusion; trouble speaking or understanding; severe headaches; sudden numbness or weakness of the face, arm or leg; trouble walking; dizziness; loss of balance or coordination Side effects that usually do not require medical attention (report to your doctor or health care professional if they continue or are bothersome): -acne -back pain -breast pain -changes in weight -dizziness -general ill feeling or flu-like symptoms -headache -irregular menstrual bleeding -nausea -sore throat -vaginal irritation or inflammation This list may not describe all possible side effects. Call your doctor for medical advice about side effects. You may report side effects to FDA at 1-800-FDA-1088. Where should I keep my medicine? This drug is given in a hospital or clinic and will not be stored at home. NOTE: This sheet is a summary. It may not cover all possible information. If you have questions about this medicine, talk to your doctor, pharmacist, or health care provider.  2018 Elsevier/Gold Standard (2015-10-31 11:19:22)   Levonorgestrel intrauterine device (IUD) What is this medicine? LEVONORGESTREL IUD (LEE voe nor jes trel) is a contraceptive (birth control) device. The device is placed inside the uterus by a healthcare professional. It is used to prevent pregnancy. This device can also be used to treat heavy bleeding that occurs during your period. This medicine may be used for other purposes; ask your health care provider or pharmacist if you have questions. COMMON BRAND NAME(S): Kyleena, LILETTA, Mirena, Skyla What should I tell my health care provider before I take this medicine? They need to know if you have any of these conditions: -abnormal Pap smear -cancer of the breast,  uterus, or cervix -diabetes -endometritis -genital or pelvic infection now or in the past -have more than one sexual partner or your partner has more than one partner -heart disease -history of an ectopic or tubal pregnancy -immune system problems -IUD in place -liver disease or tumor -problems with blood clots or take blood-thinners -seizures -use intravenous drugs -uterus of unusual shape -vaginal bleeding that has not been explained -an unusual or allergic reaction to levonorgestrel, other hormones, silicone, or polyethylene, medicines, foods, dyes, or preservatives -pregnant or trying to get pregnant -breast-feeding How should I use this medicine? This device is placed inside the uterus by a health care professional. Talk to your pediatrician regarding the use of this medicine in children. Special care may be needed. Overdosage: If you think you have taken too much of this medicine contact a poison control center or emergency room at once. NOTE: This medicine is only for you. Do not share this medicine with others. What if I miss a dose? This does not apply. Depending on the brand of device you have inserted, the device will need to be replaced every 3 to 5 years if you wish to continue using this type of birth control. What may interact with this medicine? Do not take this medicine with any of the following medications: -amprenavir -bosentan -fosamprenavir This medicine may also interact with the following medications: -aprepitant -armodafinil -barbiturate medicines for inducing sleep or treating seizures -bexarotene -boceprevir -griseofulvin -medicines to treat seizures like carbamazepine, ethotoin, felbamate, oxcarbazepine, phenytoin, topiramate -modafinil -pioglitazone -rifabutin -rifampin -rifapentine -some medicines to treat HIV infection like atazanavir, efavirenz, indinavir, lopinavir, nelfinavir, tipranavir, ritonavir -St. John's wort -warfarin This list may  not describe all possible interactions. Give your health care provider   of all the medicines, herbs, non-prescription drugs, or dietary supplements you use. Also tell them if you smoke, drink alcohol, or use illegal drugs. Some items may interact with your medicine. What should I watch for while using this medicine? Visit your doctor or health care professional for regular check ups. See your doctor if you or your partner has sexual contact with others, becomes HIV positive, or gets a sexual transmitted disease. This product does not protect you against HIV infection (AIDS) or other sexually transmitted diseases. You can check the placement of the IUD yourself by reaching up to the top of your vagina with clean fingers to feel the threads. Do not pull on the threads. It is a good habit to check placement after each menstrual period. Call your doctor right away if you feel more of the IUD than just the threads or if you cannot feel the threads at all. The IUD may come out by itself. You may become pregnant if the device comes out. If you notice that the IUD has come out use a backup birth control method like condoms and call your health care provider. Using tampons will not change the position of the IUD and are okay to use during your period. This IUD can be safely scanned with magnetic resonance imaging (MRI) only under specific conditions. Before you have an MRI, tell your healthcare provider that you have an IUD in place, and which type of IUD you have in place. What side effects may I notice from receiving this medicine? Side effects that you should report to your doctor or health care professional as soon as possible: -allergic reactions like skin rash, itching or hives, swelling of the face, lips, or tongue -fever, flu-like symptoms -genital sores -high blood pressure -no menstrual period for 6 weeks during use -pain, swelling, warmth in the leg -pelvic pain or tenderness -severe or  sudden headache -signs of pregnancy -stomach cramping -sudden shortness of breath -trouble with balance, talking, or walking -unusual vaginal bleeding, discharge -yellowing of the eyes or skin Side effects that usually do not require medical attention (report to your doctor or health care professional if they continue or are bothersome): -acne -breast pain -change in sex drive or performance -changes in weight -cramping, dizziness, or faintness while the device is being inserted -headache -irregular menstrual bleeding within first 3 to 6 months of use -nausea This list may not describe all possible side effects. Call your doctor for medical advice about side effects. You may report side effects to FDA at 1-800-FDA-1088. Where should I keep my medicine? This does not apply. NOTE: This sheet is a summary. It may not cover all possible information. If you have questions about this medicine, talk to your doctor, pharmacist, or health care provider.  2018 Elsevier/Gold Standard (2016-01-24 14:14:56)

## 2016-09-28 NOTE — Progress Notes (Signed)
   PRENATAL VISIT NOTE  Subjective:  Nichole Perkins is a 31 y.o. [phone] at 1747w0d being seen today for ongoing prenatal care.  She is currently monitored for the following issues for this low-risk pregnancy and has Supervision of low-risk pregnancy; Late prenatal care affecting pregnancy, antepartum; and Previous cesarean section complicating pregnancy on her problem list.  Patient reports backache. Contractions: Irregular. Vag. Bleeding: None.  Movement: Present. Denies leaking of fluid.   The following portions of the patient's history were reviewed and updated as appropriate: allergies, current medications, past family history, past medical history, past social history, past surgical history and problem list. Problem list updated.  Objective:   Vitals:   09/28/16 0906  BP: (!) 113/56  Pulse: 76  Weight: 198 lb 1.6 oz (89.9 kg)    Fetal Status: Fetal Heart Rate (bpm): 150   Fundal Height: 33 cm Movement: Present     General:  Alert, oriented and cooperative. Patient is in no acute distress.  Skin: Skin is warm and dry. No rash noted.   Cardiovascular: Normal heart rate noted  Respiratory: Normal respiratory effort, no problems with respiration noted  Abdomen: Soft, gravid, appropriate for gestational age. Pain/Pressure: Present     Pelvic:  Cervical exam deferred        Extremities: Normal range of motion.  Edema: None  Mental Status: Normal mood and affect. Normal behavior. Normal judgment and thought content.   Assessment and Plan:  Pregnancy: [id number] at 4947w0d  1. Encounter for supervision of low-risk pregnancy in third trimester - Reviewed VBAC with patient. Patient is aware and understanding of risks and complications and would still like to undergo TOLAC - Suggested warm heating pad, massages, and/or pregnancy belt for backache - [facility] for patient to review and schedule appointments - Discussed contraception after pregnancy. Patient deciding  between IUD vs. Nexplanon. Information about both given  2.  Pt has less than 3 contraction a day.  Back ache is low and para spinous.  Suggest massage, pregnancy belt, and heating pad.  No evidence of preterm labor  Preterm labor symptoms and general obstetric precautions including but not limited to vaginal bleeding, contractions, leaking of fluid and fetal movement were reviewed in detail with the patient. Please refer to After Visit Summary for other counseling recommendations.  Return in about 2 weeks (around 10/12/2016).   I confirm that I have verified the information documented in the medical student's note and that I have also personally performed/reperformed the physical exam and all medical decision making activities.   Elsie LincolnKelly Leggett, MD     Caryn BeeKeku, Reginald Mangels A, Medical Student 09/28/2016 9:38 AM

## 2016-10-12 ENCOUNTER — Ambulatory Visit (INDEPENDENT_AMBULATORY_CARE_PROVIDER_SITE_OTHER): Payer: BLUE CROSS/BLUE SHIELD | Admitting: Obstetrics & Gynecology

## 2016-10-12 VITALS — BP 112/52 | HR 74 | Wt 200.0 lb

## 2016-10-12 DIAGNOSIS — Z3493 Encounter for supervision of normal pregnancy, unspecified, third trimester: Secondary | ICD-10-CM

## 2016-10-12 DIAGNOSIS — O0933 Supervision of pregnancy with insufficient antenatal care, third trimester: Secondary | ICD-10-CM

## 2016-10-12 DIAGNOSIS — O0930 Supervision of pregnancy with insufficient antenatal care, unspecified trimester: Secondary | ICD-10-CM

## 2016-10-12 MED ORDER — HYDROCORTISONE ACETATE 25 MG RE SUPP: 25.0000 mg | Freq: Two times a day (BID) | RECTAL | 1 refills | Status: DC

## 2016-10-12 MED ORDER — DIPHENHYDRAMINE HCL 25 MG PO CAPS: 25.0000 mg | ORAL_CAPSULE | Freq: Four times a day (QID) | ORAL | 0 refills | Status: DC | PRN

## 2016-10-12 NOTE — Progress Notes (Signed)
Pt c/o left sided headache x1 week. Has not tried any otc for this Also c/o hemorrhoids

## 2016-10-12 NOTE — Patient Instructions (Signed)
Third Trimester of Pregnancy The third trimester is from week 28 through week 40 (months 7 through 9). The third trimester is a time when the unborn baby (fetus) is growing rapidly. At the end of the ninth month, the fetus is about 20 inches in length and weighs 6-10 pounds. Body changes during your third trimester Your body will continue to go through many changes during pregnancy. The changes vary from woman to woman. During the third trimester:  Your weight will continue to increase. You can expect to gain 25-35 pounds (11-16 kg) by the end of the pregnancy.  You may begin to get stretch marks on your hips, abdomen, and breasts.  You may urinate more often because the fetus is moving lower into your pelvis and pressing on your bladder.  You may develop or continue to have heartburn. This is caused by increased hormones that slow down muscles in the digestive tract.  You may develop or continue to have constipation because increased hormones slow digestion and cause the muscles that push waste through your intestines to relax.  You may develop hemorrhoids. These are swollen veins (varicose veins) in the rectum that can itch or be painful.  You may develop swollen, bulging veins (varicose veins) in your legs.  You may have increased body aches in the pelvis, back, or thighs. This is due to weight gain and increased hormones that are relaxing your joints.  You may have changes in your hair. These can include thickening of your hair, rapid growth, and changes in texture. Some women also have hair loss during or after pregnancy, or hair that feels dry or thin. Your hair will most likely return to normal after your baby is born.  Your breasts will continue to grow and they will continue to become tender. A yellow fluid (colostrum) may leak from your breasts. This is the first milk you are producing for your baby.  Your belly button may stick out.  You may notice more swelling in your hands,  face, or ankles.  You may have increased tingling or numbness in your hands, arms, and legs. The skin on your belly may also feel numb.  You may feel short of breath because of your expanding uterus.  You may have more problems sleeping. This can be caused by the size of your belly, increased need to urinate, and an increase in your body's metabolism.  You may notice the fetus "dropping," or moving lower in your abdomen (lightening).  You may have increased vaginal discharge.  You may notice your joints feel loose and you may have pain around your pelvic bone.  What to expect at prenatal visits You will have prenatal exams every 2 weeks until week 36. Then you will have weekly prenatal exams. During a routine prenatal visit:  You will be weighed to make sure you and the baby are growing normally.  Your blood pressure will be taken.  Your abdomen will be measured to track your baby's growth.  The fetal heartbeat will be listened to.  Any test results from the previous visit will be discussed.  You may have a cervical check near your due date to see if your cervix has softened or thinned (effaced).  You will be tested for Group B streptococcus. This happens between 35 and 37 weeks.  Your health care provider may ask you:  What your birth plan is.  How you are feeling.  If you are feeling the baby move.  If you have had   any abnormal symptoms, such as leaking fluid, bleeding, severe headaches, or abdominal cramping.  If you are using any tobacco products, including cigarettes, chewing tobacco, and electronic cigarettes.  If you have any questions.  Other tests or screenings that may be performed during your third trimester include:  Blood tests that check for low iron levels (anemia).  Fetal testing to check the health, activity level, and growth of the fetus. Testing is done if you have certain medical conditions or if there are problems during the  pregnancy.  Nonstress test (NST). This test checks the health of your baby to make sure there are no signs of problems, such as the baby not getting enough oxygen. During this test, a belt is placed around your belly. The baby is made to move, and its heart rate is monitored during movement.  What is false labor? False labor is a condition in which you feel small, irregular tightenings of the muscles in the womb (contractions) that usually go away with rest, changing position, or drinking water. These are called Braxton Hicks contractions. Contractions may last for hours, days, or even weeks before true labor sets in. If contractions come at regular intervals, become more frequent, increase in intensity, or become painful, you should see your health care provider. What are the signs of labor?  Abdominal cramps.  Regular contractions that start at 10 minutes apart and become stronger and more frequent with time.  Contractions that start on the top of the uterus and spread down to the lower abdomen and back.  Increased pelvic pressure and dull back pain.  A watery or bloody mucus discharge that comes from the vagina.  Leaking of amniotic fluid. This is also known as your "water breaking." It could be a slow trickle or a gush. Let your health care provider know if it has a color or strange odor. If you have any of these signs, call your health care provider right away, even if it is before your due date. Follow these instructions at home: Medicines  Follow your health care provider's instructions regarding medicine use. Specific medicines may be either safe or unsafe to take during pregnancy.  Take a prenatal vitamin that contains at least 600 micrograms (mcg) of folic acid.  If you develop constipation, try taking a stool softener if your health care provider approves. Eating and drinking  Eat a balanced diet that includes fresh fruits and vegetables, whole grains, good sources of protein  such as meat, eggs, or tofu, and low-fat dairy. Your health care provider will help you determine the amount of weight gain that is right for you.  Avoid raw meat and uncooked cheese. These carry germs that can cause birth defects in the baby.  If you have low calcium intake from food, talk to your health care provider about whether you should take a daily calcium supplement.  Eat four or five small meals rather than three large meals a day.  Limit foods that are high in fat and processed sugars, such as fried and sweet foods.  To prevent constipation: ? Drink enough fluid to keep your urine clear or pale yellow. ? Eat foods that are high in fiber, such as fresh fruits and vegetables, whole grains, and beans. Activity  Exercise only as directed by your health care provider. Most women can continue their usual exercise routine during pregnancy. Try to exercise for 30 minutes at least 5 days a week. Stop exercising if you experience uterine contractions.  Avoid heavy   lifting.  Do not exercise in extreme heat or humidity, or at high altitudes.  Wear low-heel, comfortable shoes.  Practice good posture.  You may continue to have sex unless your health care provider tells you otherwise. Relieving pain and discomfort  Take frequent breaks and rest with your legs elevated if you have leg cramps or low back pain.  Take warm sitz baths to soothe any pain or discomfort caused by hemorrhoids. Use hemorrhoid cream if your health care provider approves.  Wear a good support bra to prevent discomfort from breast tenderness.  If you develop varicose veins: ? Wear support pantyhose or compression stockings as told by your healthcare provider. ? Elevate your feet for 15 minutes, 3-4 times a day. Prenatal care  Write down your questions. Take them to your prenatal visits.  Keep all your prenatal visits as told by your health care provider. This is important. Safety  Wear your seat belt at  all times when driving.  Make a list of emergency phone numbers, including numbers for family, friends, the hospital, and police and fire departments. General instructions  Avoid cat litter boxes and soil used by cats. These carry germs that can cause birth defects in the baby. If you have a cat, ask someone to clean the litter box for you.  Do not travel far distances unless it is absolutely necessary and only with the approval of your health care provider.  Do not use hot tubs, steam rooms, or saunas.  Do not drink alcohol.  Do not use any products that contain nicotine or tobacco, such as cigarettes and e-cigarettes. If you need help quitting, ask your health care provider.  Do not use any medicinal herbs or unprescribed drugs. These chemicals affect the formation and growth of the baby.  Do not douche or use tampons or scented sanitary pads.  Do not cross your legs for long periods of time.  To prepare for the arrival of your baby: ? Take prenatal classes to understand, practice, and ask questions about labor and delivery. ? Make a trial run to the hospital. ? Visit the hospital and tour the maternity area. ? Arrange for maternity or paternity leave through employers. ? Arrange for family and friends to take care of pets while you are in the hospital. ? Purchase a rear-facing car seat and make sure you know how to install it in your car. ? Pack your hospital bag. ? Prepare the baby's nursery. Make sure to remove all pillows and stuffed animals from the baby's crib to prevent suffocation.  Visit your dentist if you have not gone during your pregnancy. Use a soft toothbrush to brush your teeth and be gentle when you floss. Contact a health care provider if:  You are unsure if you are in labor or if your water has broken.  You become dizzy.  You have mild pelvic cramps, pelvic pressure, or nagging pain in your abdominal area.  You have lower back pain.  You have persistent  nausea, vomiting, or diarrhea.  You have an unusual or bad smelling vaginal discharge.  You have pain when you urinate. Get help right away if:  Your water breaks before 37 weeks.  You have regular contractions less than 5 minutes apart before 37 weeks.  You have a fever.  You are leaking fluid from your vagina.  You have spotting or bleeding from your vagina.  You have severe abdominal pain or cramping.  You have rapid weight loss or weight gain.    You have shortness of breath with chest pain.  You notice sudden or extreme swelling of your face, hands, ankles, feet, or legs.  Your baby makes fewer than 10 movements in 2 hours.  You have severe headaches that do not go away when you take medicine.  You have vision changes. Summary  The third trimester is from week 28 through week 40, months 7 through 9. The third trimester is a time when the unborn baby (fetus) is growing rapidly.  During the third trimester, your discomfort may increase as you and your baby continue to gain weight. You may have abdominal, leg, and back pain, sleeping problems, and an increased need to urinate.  During the third trimester your breasts will keep growing and they will continue to become tender. A yellow fluid (colostrum) may leak from your breasts. This is the first milk you are producing for your baby.  False labor is a condition in which you feel small, irregular tightenings of the muscles in the womb (contractions) that eventually go away. These are called Braxton Hicks contractions. Contractions may last for hours, days, or even weeks before true labor sets in.  Signs of labor can include: abdominal cramps; regular contractions that start at 10 minutes apart and become stronger and more frequent with time; watery or bloody mucus discharge that comes from the vagina; increased pelvic pressure and dull back pain; and leaking of amniotic fluid. This information is not intended to replace advice  given to you by your health care provider. Make sure you discuss any questions you have with your health care provider. Document Released: 04/07/2001 Document Revised: 09/19/2015 Document Reviewed: 06/14/2012 [employer] Interactive Patient Education  2017 [employer]  

## 2016-10-12 NOTE — Progress Notes (Signed)
   PRENATAL VISIT NOTE  Subjective:  Nichole Perkins is a 31 y.o. [phone] at 1463w0d being seen today for ongoing prenatal care.  She is currently monitored for the following issues for this high-risk pregnancy and has Supervision of low-risk pregnancy; Late prenatal care affecting pregnancy, antepartum; and Previous cesarean section complicating pregnancy on her problem list.  Patient reports no complaints.  Contractions: Irritability. Vag. Bleeding: None.  Movement: Present. Denies leaking of fluid.   The following portions of the patient's history were reviewed and updated as appropriate: allergies, current medications, past family history, past medical history, past social history, past surgical history and problem list. Problem list updated.  Objective:   Vitals:   10/12/16 0932  BP: (!) 112/52  Pulse: 74  Weight: 200 lb (90.7 kg)    Fetal Status: Fetal Heart Rate (bpm): 144 Fundal Height: 33 cm Movement: Present     General:  Alert, oriented and cooperative. Patient is in no acute distress.  Skin: Skin is warm and dry. No rash noted.   Cardiovascular: Normal heart rate noted  Respiratory: Normal respiratory effort, no problems with respiration noted  Abdomen: Soft, gravid, appropriate for gestational age. Pain/Pressure: Present     Pelvic:  Cervical exam deferred        Extremities: Normal range of motion.  Edema: None  Mental Status: Normal mood and affect. Normal behavior. Normal judgment and thought content.   Assessment and Plan:  Pregnancy: [id number] at 3263w0d  1. Encounter for supervision of low-risk pregnancy in third trimester Tx hemorrhoids and possible sinus headache  2. Late prenatal care affecting pregnancy, antepartum  - hydrocortisone (ANUSOL-HC) 25 MG suppository; Place 1 suppository (25 mg total) rectally 2 (two) times daily.  Dispense: 12 suppository; Refill: 1 - diphenhydrAMINE (BENADRYL) 25 mg capsule; Take 1 capsule (25 mg total) by mouth every 6 (six) hours as  needed.  Dispense: 30 capsule; Refill: 0  Preterm labor symptoms and general obstetric precautions including but not limited to vaginal bleeding, contractions, leaking of fluid and fetal movement were reviewed in detail with the patient. Please refer to After Visit Summary for other counseling recommendations.  Return in about 2 weeks (around 10/26/2016).   Scheryl DarterJames Lorenz Donley, MD

## 2016-10-30 ENCOUNTER — Ambulatory Visit (INDEPENDENT_AMBULATORY_CARE_PROVIDER_SITE_OTHER): Payer: BLUE CROSS/BLUE SHIELD | Admitting: Obstetrics & Gynecology

## 2016-10-30 VITALS — BP 122/76 | HR 74 | Wt 200.0 lb

## 2016-10-30 DIAGNOSIS — Z3493 Encounter for supervision of normal pregnancy, unspecified, third trimester: Secondary | ICD-10-CM

## 2016-10-30 NOTE — Patient Instructions (Signed)
Third Trimester of Pregnancy The third trimester is from week 28 through week 40 (months 7 through 9). The third trimester is a time when the unborn baby (fetus) is growing rapidly. At the end of the ninth month, the fetus is about 20 inches in length and weighs 6-10 pounds. Body changes during your third trimester Your body will continue to go through many changes during pregnancy. The changes vary from woman to woman. During the third trimester:  Your weight will continue to increase. You can expect to gain 25-35 pounds (11-16 kg) by the end of the pregnancy.  You may begin to get stretch marks on your hips, abdomen, and breasts.  You may urinate more often because the fetus is moving lower into your pelvis and pressing on your bladder.  You may develop or continue to have heartburn. This is caused by increased hormones that slow down muscles in the digestive tract.  You may develop or continue to have constipation because increased hormones slow digestion and cause the muscles that push waste through your intestines to relax.  You may develop hemorrhoids. These are swollen veins (varicose veins) in the rectum that can itch or be painful.  You may develop swollen, bulging veins (varicose veins) in your legs.  You may have increased body aches in the pelvis, back, or thighs. This is due to weight gain and increased hormones that are relaxing your joints.  You may have changes in your hair. These can include thickening of your hair, rapid growth, and changes in texture. Some women also have hair loss during or after pregnancy, or hair that feels dry or thin. Your hair will most likely return to normal after your baby is born.  Your breasts will continue to grow and they will continue to become tender. A yellow fluid (colostrum) may leak from your breasts. This is the first milk you are producing for your baby.  Your belly button may stick out.  You may notice more swelling in your hands,  face, or ankles.  You may have increased tingling or numbness in your hands, arms, and legs. The skin on your belly may also feel numb.  You may feel short of breath because of your expanding uterus.  You may have more problems sleeping. This can be caused by the size of your belly, increased need to urinate, and an increase in your body's metabolism.  You may notice the fetus "dropping," or moving lower in your abdomen (lightening).  You may have increased vaginal discharge.  You may notice your joints feel loose and you may have pain around your pelvic bone.  What to expect at prenatal visits You will have prenatal exams every 2 weeks until week 36. Then you will have weekly prenatal exams. During a routine prenatal visit:  You will be weighed to make sure you and the baby are growing normally.  Your blood pressure will be taken.  Your abdomen will be measured to track your baby's growth.  The fetal heartbeat will be listened to.  Any test results from the previous visit will be discussed.  You may have a cervical check near your due date to see if your cervix has softened or thinned (effaced).  You will be tested for Group B streptococcus. This happens between 35 and 37 weeks.  Your health care provider may ask you:  What your birth plan is.  How you are feeling.  If you are feeling the baby move.  If you have had   any abnormal symptoms, such as leaking fluid, bleeding, severe headaches, or abdominal cramping.  If you are using any tobacco products, including cigarettes, chewing tobacco, and electronic cigarettes.  If you have any questions.  Other tests or screenings that may be performed during your third trimester include:  Blood tests that check for low iron levels (anemia).  Fetal testing to check the health, activity level, and growth of the fetus. Testing is done if you have certain medical conditions or if there are problems during the  pregnancy.  Nonstress test (NST). This test checks the health of your baby to make sure there are no signs of problems, such as the baby not getting enough oxygen. During this test, a belt is placed around your belly. The baby is made to move, and its heart rate is monitored during movement.  What is false labor? False labor is a condition in which you feel small, irregular tightenings of the muscles in the womb (contractions) that usually go away with rest, changing position, or drinking water. These are called Braxton Hicks contractions. Contractions may last for hours, days, or even weeks before true labor sets in. If contractions come at regular intervals, become more frequent, increase in intensity, or become painful, you should see your health care provider. What are the signs of labor?  Abdominal cramps.  Regular contractions that start at 10 minutes apart and become stronger and more frequent with time.  Contractions that start on the top of the uterus and spread down to the lower abdomen and back.  Increased pelvic pressure and dull back pain.  A watery or bloody mucus discharge that comes from the vagina.  Leaking of amniotic fluid. This is also known as your "water breaking." It could be a slow trickle or a gush. Let your health care provider know if it has a color or strange odor. If you have any of these signs, call your health care provider right away, even if it is before your due date. Follow these instructions at home: Medicines  Follow your health care provider's instructions regarding medicine use. Specific medicines may be either safe or unsafe to take during pregnancy.  Take a prenatal vitamin that contains at least 600 micrograms (mcg) of folic acid.  If you develop constipation, try taking a stool softener if your health care provider approves. Eating and drinking  Eat a balanced diet that includes fresh fruits and vegetables, whole grains, good sources of protein  such as meat, eggs, or tofu, and low-fat dairy. Your health care provider will help you determine the amount of weight gain that is right for you.  Avoid raw meat and uncooked cheese. These carry germs that can cause birth defects in the baby.  If you have low calcium intake from food, talk to your health care provider about whether you should take a daily calcium supplement.  Eat four or five small meals rather than three large meals a day.  Limit foods that are high in fat and processed sugars, such as fried and sweet foods.  To prevent constipation: ? Drink enough fluid to keep your urine clear or pale yellow. ? Eat foods that are high in fiber, such as fresh fruits and vegetables, whole grains, and beans. Activity  Exercise only as directed by your health care provider. Most women can continue their usual exercise routine during pregnancy. Try to exercise for 30 minutes at least 5 days a week. Stop exercising if you experience uterine contractions.  Avoid heavy   lifting.  Do not exercise in extreme heat or humidity, or at high altitudes.  Wear low-heel, comfortable shoes.  Practice good posture.  You may continue to have sex unless your health care provider tells you otherwise. Relieving pain and discomfort  Take frequent breaks and rest with your legs elevated if you have leg cramps or low back pain.  Take warm sitz baths to soothe any pain or discomfort caused by hemorrhoids. Use hemorrhoid cream if your health care provider approves.  Wear a good support bra to prevent discomfort from breast tenderness.  If you develop varicose veins: ? Wear support pantyhose or compression stockings as told by your healthcare provider. ? Elevate your feet for 15 minutes, 3-4 times a day. Prenatal care  Write down your questions. Take them to your prenatal visits.  Keep all your prenatal visits as told by your health care provider. This is important. Safety  Wear your seat belt at  all times when driving.  Make a list of emergency phone numbers, including numbers for family, friends, the hospital, and police and fire departments. General instructions  Avoid cat litter boxes and soil used by cats. These carry germs that can cause birth defects in the baby. If you have a cat, ask someone to clean the litter box for you.  Do not travel far distances unless it is absolutely necessary and only with the approval of your health care provider.  Do not use hot tubs, steam rooms, or saunas.  Do not drink alcohol.  Do not use any products that contain nicotine or tobacco, such as cigarettes and e-cigarettes. If you need help quitting, ask your health care provider.  Do not use any medicinal herbs or unprescribed drugs. These chemicals affect the formation and growth of the baby.  Do not douche or use tampons or scented sanitary pads.  Do not cross your legs for long periods of time.  To prepare for the arrival of your baby: ? Take prenatal classes to understand, practice, and ask questions about labor and delivery. ? Make a trial run to the hospital. ? Visit the hospital and tour the maternity area. ? Arrange for maternity or paternity leave through employers. ? Arrange for family and friends to take care of pets while you are in the hospital. ? Purchase a rear-facing car seat and make sure you know how to install it in your car. ? Pack your hospital bag. ? Prepare the baby's nursery. Make sure to remove all pillows and stuffed animals from the baby's crib to prevent suffocation.  Visit your dentist if you have not gone during your pregnancy. Use a soft toothbrush to brush your teeth and be gentle when you floss. Contact a health care provider if:  You are unsure if you are in labor or if your water has broken.  You become dizzy.  You have mild pelvic cramps, pelvic pressure, or nagging pain in your abdominal area.  You have lower back pain.  You have persistent  nausea, vomiting, or diarrhea.  You have an unusual or bad smelling vaginal discharge.  You have pain when you urinate. Get help right away if:  Your water breaks before 37 weeks.  You have regular contractions less than 5 minutes apart before 37 weeks.  You have a fever.  You are leaking fluid from your vagina.  You have spotting or bleeding from your vagina.  You have severe abdominal pain or cramping.  You have rapid weight loss or weight gain.    You have shortness of breath with chest pain.  You notice sudden or extreme swelling of your face, hands, ankles, feet, or legs.  Your baby makes fewer than 10 movements in 2 hours.  You have severe headaches that do not go away when you take medicine.  You have vision changes. Summary  The third trimester is from week 28 through week 40, months 7 through 9. The third trimester is a time when the unborn baby (fetus) is growing rapidly.  During the third trimester, your discomfort may increase as you and your baby continue to gain weight. You may have abdominal, leg, and back pain, sleeping problems, and an increased need to urinate.  During the third trimester your breasts will keep growing and they will continue to become tender. A yellow fluid (colostrum) may leak from your breasts. This is the first milk you are producing for your baby.  False labor is a condition in which you feel small, irregular tightenings of the muscles in the womb (contractions) that eventually go away. These are called Braxton Hicks contractions. Contractions may last for hours, days, or even weeks before true labor sets in.  Signs of labor can include: abdominal cramps; regular contractions that start at 10 minutes apart and become stronger and more frequent with time; watery or bloody mucus discharge that comes from the vagina; increased pelvic pressure and dull back pain; and leaking of amniotic fluid. This information is not intended to replace advice  given to you by your health care provider. Make sure you discuss any questions you have with your health care provider. Document Released: 04/07/2001 Document Revised: 09/19/2015 Document Reviewed: 06/14/2012 [employer] Interactive Patient Education  2017 [employer]  

## 2016-10-30 NOTE — Progress Notes (Signed)
   PRENATAL VISIT NOTE  Subjective:  Nichole Perkins is a 31 y.o. [phone] at 6357w4d being seen today for ongoing prenatal care.  She is currently monitored for the following issues for this high-risk pregnancy and has Supervision of low-risk pregnancy; Late prenatal care affecting pregnancy, antepartum; and Previous cesarean section complicating pregnancy on her problem list.  Patient reports occasional contractions and pelvic pressure.  Contractions: Irritability.  .  Movement: Present. Denies leaking of fluid.   The following portions of the patient's history were reviewed and updated as appropriate: allergies, current medications, past family history, past medical history, past social history, past surgical history and problem list. Problem list updated.  Objective:   Vitals:   10/30/16 1016  BP: 122/76  Pulse: 74  Weight: 200 lb (90.7 kg)    Fetal Status: Fetal Heart Rate (bpm): 134   Movement: Present     General:  Alert, oriented and cooperative. Patient is in no acute distress.  Skin: Skin is warm and dry. No rash noted.   Cardiovascular: Normal heart rate noted  Respiratory: Normal respiratory effort, no problems with respiration noted  Abdomen: Soft, gravid, appropriate for gestational age. Pain/Pressure: Present     Pelvic:  Cervical exam deferred        Extremities: Normal range of motion.  Edema: None  Mental Status: Normal mood and affect. Normal behavior. Normal judgment and thought content.   Assessment and Plan:  Pregnancy: [id number] at 3357w4d  There are no diagnoses linked to this encounter. Preterm labor symptoms and general obstetric precautions including but not limited to vaginal bleeding, contractions, leaking of fluid and fetal movement were reviewed in detail with the patient. Please refer to After Visit Summary for other counseling recommendations.  Return in about 2 weeks (around 11/13/2016).   Scheryl DarterJames Arnold, MD

## 2016-11-01 ENCOUNTER — Encounter (HOSPITAL_COMMUNITY): Payer: Self-pay

## 2016-11-01 ENCOUNTER — Inpatient Hospital Stay (HOSPITAL_COMMUNITY)
Admission: AD | Admit: 2016-11-01 | Discharge: 2016-11-01 | Disposition: A | Discharge status: Home / Self Care | Payer: BLUE CROSS/BLUE SHIELD | Source: Ambulatory Visit | Attending: Obstetrics & Gynecology | Admitting: Obstetrics & Gynecology

## 2016-11-01 DIAGNOSIS — M549 Dorsalgia, unspecified: Secondary | ICD-10-CM | POA: Diagnosis present

## 2016-11-01 DIAGNOSIS — Z3A36 36 weeks gestation of pregnancy: Secondary | ICD-10-CM | POA: Diagnosis not present

## 2016-11-01 DIAGNOSIS — Z3A35 35 weeks gestation of pregnancy: Secondary | ICD-10-CM | POA: Insufficient documentation

## 2016-11-01 LAB — URINALYSIS, ROUTINE W REFLEX MICROSCOPIC
BILIRUBIN URINE: NEGATIVE
Glucose, UA: NEGATIVE mg/dL
Hgb urine dipstick: NEGATIVE
Ketones, ur: NEGATIVE mg/dL
Leukocytes, UA: NEGATIVE
NITRITE: NEGATIVE
Protein, ur: NEGATIVE mg/dL
SPECIFIC GRAVITY, URINE: 1.009 (ref 1.005–1.030)
pH: 7 (ref 5.0–8.0)

## 2016-11-01 MED ORDER — NIFEDIPINE 10 MG PO CAPS
10.0000 mg | ORAL_CAPSULE | ORAL | Status: DC | PRN
  Administered 2016-11-01: 10 mg via ORAL
  Filled 2016-11-01: qty 1

## 2016-11-01 NOTE — MAU Note (Signed)
Started cramping on Thursday- cramping would come go, continued into Friday. Had her regular OB visit on Friday and they said it was normal for her to have cramping at this point. This morning at 0330 pt reports waist pain started and the menstrual like cramps returning. Denies bleeding, no LOF.

## 2016-11-01 NOTE — Discharge Instructions (Signed)

## 2016-11-01 NOTE — MAU Provider Note (Signed)
Patient Nichole Perkins is a 31 y.o. [phone] here with complaints off and on again back pain and cramping since Thursday. She reports good fetal movement and no leaking of fluid. Denies bleeding or other GU/OB complaints. Her history is significant for C-section times 2; patient plans to VBAC.   Patient had a prenatal visit on Friday and was told that it was normal to have some back pain and abdominal cramping at 34 weeks but that she should come to MAU if she felt like the abdominal pain was getting worse.  She came in because she couldn't sleep last night.  She denies dysuria or abnormal discharge.   Patient is concerned because she did not labor with her other two children (C-section) and she does not know what labor feels like.  History     CSN: [id number]  Arrival date and time: 11/01/16 1012   First Provider Initiated Contact with Patient 11/01/16 1046      Chief Complaint  Patient presents with  . Back Pain  . Abdominal Cramping   Abdominal Pain  This is a new problem. The current episode started in the past 7 days. The onset quality is sudden. The problem occurs constantly. The problem has been gradually worsening. The pain is located in the suprapubic region. The pain is at a severity of 7/10. The quality of the pain is cramping. The abdominal pain radiates to the back. Associated symptoms include nausea and vomiting. Pertinent negatives include no constipation or diarrhea. The pain is aggravated by being still. The pain is relieved by activity.    OB History    Gravida Para Term Preterm AB Living   4 2 2   1 2    SAB TAB Ectopic Multiple Live Births           2      No past medical history on file.  Past Surgical History:  Procedure Laterality Date  . CESAREAN SECTION    . WRIST SURGERY Left 2010   growth removed    Family History  Problem Relation Age of Onset  . Hypertension Mother   . Diabetes Father     Social History  Substance Use Topics  . Smoking status:  Never Smoker  . Smokeless tobacco: Never Used  . Alcohol use No    Allergies: No Known Allergies  Prescriptions Prior to Admission  Medication Sig Dispense Refill Last Dose  . diphenhydrAMINE (BENADRYL) 25 mg capsule Take 1 capsule (25 mg total) by mouth every 6 (six) hours as needed. (Patient not taking: Reported on 10/30/2016) 30 capsule 0 Not Taking  . FOLIC ACID PO Take 1 tablet by mouth daily.   Taking  . hydrocortisone (ANUSOL-HC) 25 MG suppository Place 1 suppository (25 mg total) rectally 2 (two) times daily. (Patient not taking: Reported on 10/30/2016) 12 suppository 1 Not Taking  . Prenatal Vit-Fe Fumarate-FA (PRENATAL VITAMINS) 28-0.8 MG TABS Take 1 tablet by mouth daily. 60 tablet 3 Taking  . promethazine (PHENERGAN) 25 MG tablet Take 1 tablet (25 mg total) by mouth every 6 (six) hours as needed for nausea or vomiting. (Patient not taking: Reported on 08/26/2016) 30 tablet 1 Not Taking  . terconazole (TERAZOL 7) 0.4 % vaginal cream Place 1 applicator vaginally at bedtime. 45 g 0 Taking    Review of Systems  Respiratory: Negative.   Cardiovascular: Negative.   Gastrointestinal: Positive for abdominal pain, nausea and vomiting. Negative for constipation and diarrhea.  Genitourinary: Negative.   Musculoskeletal: Negative.  Psychiatric/Behavioral: Negative.    Physical Exam   Blood pressure (!) 119/58, pulse 72, temperature 98.6 F (37 C), temperature source Oral, resp. rate 18, height 5\' 4"  (1.626 m), weight 200 lb (90.7 kg), last menstrual period 03/02/2016.  Physical Exam  Constitutional: She is oriented to person, place, and time. She appears well-developed and well-nourished.  HENT:  Head: Normocephalic.  Neck: Normal range of motion.  Cardiovascular: Normal rate.   Respiratory: Effort normal.  GI: Soft.  No CVAT  Genitourinary: Vagina normal.  Genitourinary Comments: NEFG; cervix is long closed and thick  Musculoskeletal: Normal range of motion.  Neurological: She  is alert and oriented to person, place, and time.  Skin: Skin is warm and dry.  Psychiatric: She has a normal mood and affect.    MAU Course  Procedures  MDM -PO hydration -NST: 135 bpm, present acel, no decel, occasional irregular contractions.  -UA: negative for ketones or nitrites -Procardia 10 mg; patient feels much better after procardia.  Patient requesting to go home.  Assessment and Plan   1. Preterm labor in third trimester without delivery    2. Patient stable for discharge; reviewed warning signs and when to return to the MAU.  3. Patient and husband verbalized understanding.   Charlesetta Garibaldi Holli Rengel 11/01/2016, 10:46 AM

## 2016-11-09 ENCOUNTER — Ambulatory Visit (INDEPENDENT_AMBULATORY_CARE_PROVIDER_SITE_OTHER): Payer: BLUE CROSS/BLUE SHIELD | Admitting: Medical

## 2016-11-09 ENCOUNTER — Other Ambulatory Visit (HOSPITAL_COMMUNITY)
Admission: RE | Admit: 2016-11-09 | Discharge: 2016-11-09 | Disposition: A | Discharge status: Home / Self Care | Payer: BLUE CROSS/BLUE SHIELD | Source: Ambulatory Visit | Attending: Advanced Practice Midwife | Admitting: Advanced Practice Midwife

## 2016-11-09 VITALS — BP 114/54 | HR 78 | Wt 202.1 lb

## 2016-11-09 DIAGNOSIS — Z3493 Encounter for supervision of normal pregnancy, unspecified, third trimester: Secondary | ICD-10-CM | POA: Diagnosis present

## 2016-11-09 DIAGNOSIS — Z3483 Encounter for supervision of other normal pregnancy, third trimester: Secondary | ICD-10-CM

## 2016-11-09 NOTE — Progress Notes (Signed)
   PRENATAL VISIT NOTE  Subjective:  Nichole Perkins is a 31 y.o. [phone] at 450w0d being seen today for ongoing prenatal care.  She is currently monitored for the following issues for this low-risk pregnancy and has Supervision of low-risk pregnancy; Late prenatal care affecting pregnancy, antepartum; and Previous cesarean section complicating pregnancy on her problem list.  Patient reports no complaints.  Contractions: Irregular. Vag. Bleeding: None.  Movement: Present. Denies leaking of fluid.   The following portions of the patient's history were reviewed and updated as appropriate: allergies, current medications, past family history, past medical history, past social history, past surgical history and problem list. Problem list updated.  Objective:   Vitals:   11/09/16 1440  BP: (!) 114/54  Pulse: 78  Weight: 202 lb 1.6 oz (91.7 kg)    Fetal Status: Fetal Heart Rate (bpm): 138 Fundal Height: 36 cm Movement: Present     General:  Alert, oriented and cooperative. Patient is in no acute distress.  Skin: Skin is warm and dry. No rash noted.   Cardiovascular: Normal heart rate noted  Respiratory: Normal respiratory effort, no problems with respiration noted  Abdomen: Soft, gravid, appropriate for gestational age.  Pain/Pressure: Present     Pelvic: Cervical exam performed Dilation: Closed Effacement (%): 50 Station: Ballotable  Extremities: Normal range of motion.  Edema: None  Mental Status:  Normal mood and affect. Normal behavior. Normal judgment and thought content.   Assessment and Plan:  Pregnancy: [id number] at 3650w0d  1. Encounter for supervision of low-risk pregnancy in third trimester - Culture, beta strep (group b only) - GC/Chlamydia   Preterm labor symptoms and general obstetric precautions including but not limited to vaginal bleeding, contractions, leaking of fluid and fetal movement were reviewed in detail with the patient. Please refer to After Visit Summary for other  counseling recommendations.  Return in about 1 week (around 11/16/2016) for LOB.   Vonzella NippleJulie Wenzel, PA-C

## 2016-11-09 NOTE — Patient Instructions (Signed)
Fetal Movement Counts °Patient Name: ________________________________________________ Patient Due Date: ____________________ °What is a fetal movement count? °A fetal movement count is the number of times that you feel your baby move during a certain amount of time. This may also be called a fetal kick count. A fetal movement count is recommended for every pregnant woman. You may be asked to start counting fetal movements as early as week 28 of your pregnancy. °Pay attention to when your baby is most active. You may notice your baby's sleep and wake cycles. You may also notice things that make your baby move more. You should do a fetal movement count: °· When your baby is normally most active. °· At the same time each day. ° °A good time to count movements is while you are resting, after having something to eat and drink. °How do I count fetal movements? °1. Find a quiet, comfortable area. Sit, or lie down on your side. °2. Write down the date, the start time and stop time, and the number of movements that you felt between those two times. Take this information with you to your health care visits. °3. For 2 hours, count kicks, flutters, swishes, rolls, and jabs. You should feel at least 10 movements during 2 hours. °4. You may stop counting after you have felt 10 movements. °5. If you do not feel 10 movements in 2 hours, have something to eat and drink. Then, keep resting and counting for 1 hour. If you feel at least 4 movements during that hour, you may stop counting. °Contact a health care provider if: °· You feel fewer than 4 movements in 2 hours. °· Your baby is not moving like he or she usually does. °Date: ____________ Start time: ____________ Stop time: ____________ Movements: ____________ °Date: ____________ Start time: ____________ Stop time: ____________ Movements: ____________ °Date: ____________ Start time: ____________ Stop time: ____________ Movements: ____________ °Date: ____________ Start time:  ____________ Stop time: ____________ Movements: ____________ °Date: ____________ Start time: ____________ Stop time: ____________ Movements: ____________ °Date: ____________ Start time: ____________ Stop time: ____________ Movements: ____________ °Date: ____________ Start time: ____________ Stop time: ____________ Movements: ____________ °Date: ____________ Start time: ____________ Stop time: ____________ Movements: ____________ °Date: ____________ Start time: ____________ Stop time: ____________ Movements: ____________ °This information is not intended to replace advice given to you by your health care provider. Make sure you discuss any questions you have with your health care provider. °Document Released: 05/13/2006 Document Revised: 12/11/2015 Document Reviewed: 05/23/2015 °Elsevier Interactive Patient Education © 2018 [employer] °Braxton Hicks Contractions °Contractions of the uterus can occur throughout pregnancy, but they are not always a sign that you are in labor. You may have practice contractions called Braxton Hicks contractions. These false labor contractions are sometimes confused with true labor. °What are Braxton Hicks contractions? °Braxton Hicks contractions are tightening movements that occur in the muscles of the uterus before labor. Unlike true labor contractions, these contractions do not result in opening (dilation) and thinning of the cervix. Toward the end of pregnancy (32-34 weeks), Braxton Hicks contractions can happen more often and may become stronger. These contractions are sometimes difficult to tell apart from true labor because they can be very uncomfortable. You should not feel embarrassed if you go to the hospital with false labor. °Sometimes, the only way to tell if you are in true labor is for your health care provider to look for changes in the cervix. The health care provider will do a physical exam and may monitor your contractions. If   you are not in true labor, the exam  should show that your cervix is not dilating and your water has not broken. °If there are no prenatal problems or other health problems associated with your pregnancy, it is completely safe for you to be sent home with false labor. You may continue to have Braxton Hicks contractions until you go into true labor. °How can I tell the difference between true labor and false labor? °· Differences °? False labor °? Contractions last 30-70 seconds.: Contractions are usually shorter and not as strong as true labor contractions. °? Contractions become very regular.: Contractions are usually irregular. °? Discomfort is usually felt in the top of the uterus, and it spreads to the lower abdomen and low back.: Contractions are often felt in the front of the lower abdomen and in the groin. °? Contractions do not go away with walking.: Contractions may go away when you walk around or change positions while lying down. °? Contractions usually become more intense and increase in frequency.: Contractions get weaker and are shorter-lasting as time goes on. °? The cervix dilates and gets thinner.: The cervix usually does not dilate or become thin. °Follow these instructions at home: °· Take over-the-counter and prescription medicines only as told by your health care provider. °· Keep up with your usual exercises and follow other instructions from your health care provider. °· Eat and drink lightly if you think you are going into labor. °· If Braxton Hicks contractions are making you uncomfortable: °? Change your position from lying down or resting to walking, or change from walking to resting. °? Sit and rest in a tub of warm water. °? Drink enough fluid to keep your urine clear or pale yellow. Dehydration may cause these contractions. °? Do slow and deep breathing several times an hour. °· Keep all follow-up prenatal visits as told by your health care provider. This is important. °Contact a health care provider if: °· You have a  fever. °· You have continuous pain in your abdomen. °Get help right away if: °· Your contractions become stronger, more regular, and closer together. °· You have fluid leaking or gushing from your vagina. °· You pass blood-tinged mucus (bloody show). °· You have bleeding from your vagina. °· You have low back pain that you never had before. °· You feel your baby’s head pushing down and causing pelvic pressure. °· Your baby is not moving inside you as much as it used to. °Summary °· Contractions that occur before labor are called Braxton Hicks contractions, false labor, or practice contractions. °· Braxton Hicks contractions are usually shorter, weaker, farther apart, and less regular than true labor contractions. True labor contractions usually become progressively stronger and regular and they become more frequent. °· Manage discomfort from Braxton Hicks contractions by changing position, resting in a warm bath, drinking plenty of water, or practicing deep breathing. °This information is not intended to replace advice given to you by your health care provider. Make sure you discuss any questions you have with your health care provider. °Document Released: 04/13/2005 Document Revised: 03/02/2016 Document Reviewed: 03/02/2016 °Elsevier Interactive Patient Education © 2017 [employer] ° °

## 2016-11-10 LAB — CERVICOVAGINAL ANCILLARY ONLY
Chlamydia: NEGATIVE
NEISSERIA GONORRHEA: NEGATIVE

## 2016-11-13 LAB — CULTURE, BETA STREP (GROUP B ONLY): STREP GP B CULTURE: NEGATIVE

## 2016-11-16 ENCOUNTER — Telehealth: Payer: Self-pay

## 2016-11-16 ENCOUNTER — Ambulatory Visit (INDEPENDENT_AMBULATORY_CARE_PROVIDER_SITE_OTHER): Payer: BLUE CROSS/BLUE SHIELD | Admitting: Obstetrics and Gynecology

## 2016-11-16 ENCOUNTER — Encounter: Payer: Self-pay | Admitting: Obstetrics and Gynecology

## 2016-11-16 VITALS — BP 122/73 | HR 70 | Wt 204.1 lb

## 2016-11-16 DIAGNOSIS — Z113 Encounter for screening for infections with a predominantly sexual mode of transmission: Secondary | ICD-10-CM

## 2016-11-16 DIAGNOSIS — Z3493 Encounter for supervision of normal pregnancy, unspecified, third trimester: Secondary | ICD-10-CM

## 2016-11-16 DIAGNOSIS — Z331 Pregnant state, incidental: Secondary | ICD-10-CM | POA: Diagnosis not present

## 2016-11-16 DIAGNOSIS — O34219 Maternal care for unspecified type scar from previous cesarean delivery: Secondary | ICD-10-CM

## 2016-11-16 NOTE — Telephone Encounter (Signed)
LM for pt that her FMLA paperwork is complete and has been faxed as she requested.  She can come to the front office to pick her original copy up.  If she has any questions to please give us a call at the office.

## 2016-11-16 NOTE — Telephone Encounter (Signed)
FMLA PAPERWORK COMPLETED

## 2016-11-17 ENCOUNTER — Encounter: Payer: BLUE CROSS/BLUE SHIELD | Admitting: Advanced Practice Midwife

## 2016-11-23 ENCOUNTER — Ambulatory Visit (INDEPENDENT_AMBULATORY_CARE_PROVIDER_SITE_OTHER): Payer: BLUE CROSS/BLUE SHIELD | Admitting: Advanced Practice Midwife

## 2016-11-23 VITALS — BP 104/59 | HR 89 | Wt 201.6 lb

## 2016-11-23 DIAGNOSIS — O34219 Maternal care for unspecified type scar from previous cesarean delivery: Secondary | ICD-10-CM

## 2016-11-23 DIAGNOSIS — Z3493 Encounter for supervision of normal pregnancy, unspecified, third trimester: Secondary | ICD-10-CM

## 2016-11-23 NOTE — Progress Notes (Signed)
   PRENATAL VISIT NOTE  Subjective:  Nichole Perkins is a 31 y.o. [phone] at 3661w0d being seen today for ongoing prenatal care.  She is currently monitored for the following issues for this low-risk pregnancy and has Supervision of low-risk pregnancy; Late prenatal care affecting pregnancy, antepartum; and Previous cesarean section complicating pregnancy on her problem list.  Patient reports no complaints.  Contractions: Irregular. Vag. Bleeding: None.  Movement: Present. Denies leaking of fluid.   The following portions of the patient's history were reviewed and updated as appropriate: allergies, current medications, past family history, past medical history, past social history, past surgical history and problem list. Problem list updated.  Objective:   Vitals:   11/23/16 1609  BP: (!) 104/59  Pulse: 89  Weight: 201 lb 9.6 oz (91.4 kg)    Fetal Status: Fetal Heart Rate (bpm): 134   Movement: Present     General:  Alert, oriented and cooperative. Patient is in no acute distress.  Skin: Skin is warm and dry. No rash noted.   Cardiovascular: Normal heart rate noted  Respiratory: Normal respiratory effort, no problems with respiration noted  Abdomen: Soft, gravid, appropriate for gestational age.  Pain/Pressure: Present     Pelvic: Cervical exam performed Dilation: Closed Effacement (%): 70 Station: -3  Extremities: Normal range of motion.  Edema: None  Mental Status:  Normal mood and affect. Normal behavior. Normal judgment and thought content.   Assessment and Plan:  Pregnancy: [id number] at 9961w0d  1. Encounter for supervision of low-risk pregnancy in third trimester   2. Previous cesarean section complicating pregnancy --Discussed TOLAC with pt, reviewed risks of VBAC vs repeat C/S again with pt.  Pt desires TOLAC.  She understands that she needs to begin labor on her own without induction.  Labor precautions reviewed.  Term labor symptoms and general obstetric precautions including  but not limited to vaginal bleeding, contractions, leaking of fluid and fetal movement were reviewed in detail with the patient. Please refer to After Visit Summary for other counseling recommendations.  No Follow-up on file.   Sharen CounterLisa Leftwich-Kirby, CNM

## 2016-11-30 ENCOUNTER — Other Ambulatory Visit: Payer: Self-pay | Admitting: Obstetrics & Gynecology

## 2016-11-30 ENCOUNTER — Ambulatory Visit: Payer: Self-pay

## 2016-11-30 ENCOUNTER — Ambulatory Visit (INDEPENDENT_AMBULATORY_CARE_PROVIDER_SITE_OTHER): Payer: BLUE CROSS/BLUE SHIELD | Admitting: Student

## 2016-11-30 VITALS — BP 122/69 | HR 83 | Wt 204.6 lb

## 2016-11-30 DIAGNOSIS — Z3493 Encounter for supervision of normal pregnancy, unspecified, third trimester: Secondary | ICD-10-CM

## 2016-11-30 DIAGNOSIS — O320XX0 Maternal care for unstable lie, not applicable or unspecified: Secondary | ICD-10-CM

## 2016-11-30 DIAGNOSIS — O321XX0 Maternal care for breech presentation, not applicable or unspecified: Secondary | ICD-10-CM | POA: Insufficient documentation

## 2016-11-30 NOTE — Progress Notes (Signed)
   PRENATAL VISIT NOTE  Subjective:  Nichole Perkins is a 31 y.o. [phone] at 623w0d being seen today for ongoing prenatal care.  She is currently monitored for the following issues for this low-risk pregnancy and has Supervision of low-risk pregnancy; Late prenatal care affecting pregnancy, antepartum; Previous cesarean section complicating pregnancy; and Breech presentation on her problem list.  Patient reports no complaints.  Contractions: Irregular. Vag. Bleeding: None.  Movement: Present. Denies leaking of fluid.   The following portions of the patient's history were reviewed and updated as appropriate: allergies, current medications, past family history, past medical history, past social history, past surgical history and problem list. Problem list updated.  Objective:   Vitals:   11/30/16 1551  BP: 122/69  Pulse: 83  Weight: 204 lb 9.6 oz (92.8 kg)    Fetal Status: Fetal Heart Rate (bpm): 151 Fundal Height: 39 cm Movement: Present  Presentation: Complete Breech  General:  Alert, oriented and cooperative. Patient is in no acute distress.  Skin: Skin is warm and dry. No rash noted.   Cardiovascular: Normal heart rate noted  Respiratory: Normal respiratory effort, no problems with respiration noted  Abdomen: Soft, gravid, appropriate for gestational age.  Pain/Pressure: Present     Pelvic: Cervical exam performed        Extremities: Normal range of motion.  Edema: Trace  Mental Status:  Normal mood and affect. Normal behavior. Normal judgment and thought content.   Assessment and Plan:  Pregnancy: [id number] at 373w0d  1. Encounter for supervision of low-risk pregnancy in third trimester  - US OB Limited  2. Unstable fetal lie, single or unspecified fetus Fetus in breech position (incomplete) - US OB Limited  Spoke with Dr. Erin FullingHarraway-Smith, who recommends C-section. C-section scheduled tomorrow for 3 pm. Patient given instructions on NPO and where to check in for surgery. All  questions answered. Labor precuations given, patient will come to [facility] if she beings laboring or water breaks.   Term labor symptoms and general obstetric precautions including but not limited to vaginal bleeding, contractions, leaking of fluid and fetal movement were reviewed in detail with the patient. Please refer to After Visit Summary for other counseling recommendations.  No Follow-up on file.   Marylene LandKathryn Lorraine Wren Pryce, CNM

## 2016-11-30 NOTE — Patient Instructions (Signed)
Cesarean Delivery °Cesarean birth, or cesarean delivery, is the surgical delivery of a baby through an incision in the abdomen and the uterus. This may be referred to as a C-section. This procedure may be scheduled ahead of time, or it may be done in an emergency situation. °Tell a health care provider about: °· Any allergies you have. °· All medicines you are taking, including vitamins, herbs, eye drops, creams, and over-the-counter medicines. °· Any problems you or family members have had with anesthetic medicines. °· Any blood disorders you have. °· Any surgeries you have had. °· Any medical conditions you have. °· Whether you or any members of your family have a history of deep vein thrombosis (DVT) or pulmonary embolism (PE). °What are the risks? °Generally, this is a safe procedure. However, problems may occur, including: °· Infection. °· Bleeding. °· Allergic reactions to medicines. °· Damage to other structures or organs. °· Blood clots. °· Injury to your baby. ° °What happens before the procedure? °· Follow instructions from your health care provider about eating or drinking restrictions. °· Follow instructions from your health care provider about bathing before your procedure to help reduce your risk of infection. °· If you know that you are going to have a cesarean delivery, do not shave your pubic area. Shaving before the procedure may increase your risk of infection. °· Ask your health care provider about: °? Changing or stopping your regular medicines. This is especially important if you are taking diabetes medicines or blood thinners. °? Your pain management plan. This is especially important if you plan to breastfeed your baby. °? How long you will be in the hospital after the procedure. °? Any concerns you may have about receiving blood products if you need them during the procedure. °? Cord blood banking, if you plan to collect your baby’s umbilical cord blood. °· You may also want to ask your  health care provider: °? Whether you will be able to hold or breastfeed your baby while you are still in the operating room. °? Whether your baby can stay with you immediately after the procedure and during your recovery. °? Whether a family member or a person of your choice can go with you into the operating room and stay with you during the procedure, immediately after the procedure, and during your recovery. °· Plan to have someone drive you home when you are discharged from the hospital. °What happens during the procedure? °· Fetal monitors will be placed on your abdomen to monitor your heart rate and your baby's heart rate. °· Depending on the reason for your cesarean delivery, you may have a physical exam or additional testing, such as an ultrasound. °· An IV tube will be inserted into one of your veins. °· You may have your blood or urine tested. °· You will be given antibiotic medicine to help prevent infection. °· You may be given a special warming gown to wear to keep your temperature stable. °· Hair may be removed from your pubic area. °· The skin of your pubic area and lower abdomen will be cleaned with a germ-killing solution (antiseptic). °· A catheter may be inserted into your bladder through your urethra. This drains your urine during the procedure. °· You may be given one or more of the following: °? A medicine to numb the area (local anesthetic). °? A medicine to make you fall asleep (general anesthetic). °? A medicine (regional anesthetic) that is injected into your back or through a small   thin tube placed in your back (spinal anesthetic or epidural anesthetic). This numbs everything below the injection site and allows you to stay awake during your procedure. If this makes you feel nauseous, tell your health care provider. Medicines will be available to help reduce any nausea you may feel. °· An incision will be made in your abdomen, and then in your uterus. °· If you are awake during your  procedure, you may feel tugging and pulling in your abdomen, but you should not feel pain. If you feel pain, tell your health care provider immediately. °· Your baby will be removed from your uterus. You may feel more pressure or pushing while this happens. °· Immediately after birth, your baby will be dried and kept warm. You may be able to hold and breastfeed your baby. The umbilical cord may be clamped and cut during this time. °· Your placenta will be removed from your uterus. °· Your incisions will be closed with stitches (sutures). Staples, skin glue, or adhesive strips may also be applied to the incision in your abdomen. °· Bandages (dressings) will be placed over the incision in your abdomen. °The procedure may vary among health care providers and hospitals. °What happens after the procedure? °· Your blood pressure, heart rate, breathing rate, and blood oxygen level will be monitored often until the medicines you were given have worn off. °· You may continue to receive fluids and medicines through an IV tube. °· You will have some pain. Medicines will be available to help control your pain. °· To help prevent blood clots: °? You may be given medicines. °? You may have to wear compression stockings or devices. °? You will be encouraged to walk around when you are able. °· Hospital staff will encourage and support bonding with your baby. Your hospital may allow you and your baby to stay in the same room (rooming in) during your hospital stay to encourage successful breastfeeding. °· You may be encouraged to cough and breathe deeply often. This helps to prevent lung problems. °· If you have a catheter draining your urine, it will be removed as soon as possible after your procedure. °This information is not intended to replace advice given to you by your health care provider. Make sure you discuss any questions you have with your health care provider. °Document Released: 04/13/2005 Document Revised: 09/19/2015  Document Reviewed: 01/22/2015 °Elsevier Interactive Patient Education © 2017 [employer] ° °

## 2016-11-30 NOTE — Progress Notes (Signed)
Pt informed that the ultrasound is considered a limited OB ultrasound and is not intended to be a complete ultrasound exam.  Patient also informed that the ultrasound is not being completed with the intent of assessing for fetal or placental anomalies or any pelvic abnormalities.  Explained that the purpose of today's ultrasound is to assess for presentation.  Patient acknowledges the purpose of the exam and the limitations of the study.   Complete breech presentation initially which transitioned to incomplete with one leg extended above head. Luna KitchensKathryn Kooistra, CNM notified.

## 2016-12-01 ENCOUNTER — Telehealth (HOSPITAL_COMMUNITY): Payer: Self-pay | Admitting: *Deleted

## 2016-12-01 ENCOUNTER — Encounter (HOSPITAL_COMMUNITY): Payer: Self-pay | Admitting: Anesthesiology

## 2016-12-01 ENCOUNTER — Inpatient Hospital Stay (HOSPITAL_COMMUNITY)
Admission: AD | Admit: 2016-12-01 | Discharge: 2016-12-04 | DRG: 766 | Disposition: A | Discharge status: Home / Self Care | Payer: BLUE CROSS/BLUE SHIELD | Source: Ambulatory Visit | Attending: Family Medicine | Admitting: Family Medicine

## 2016-12-01 ENCOUNTER — Inpatient Hospital Stay (HOSPITAL_COMMUNITY): Payer: BLUE CROSS/BLUE SHIELD | Admitting: Anesthesiology

## 2016-12-01 ENCOUNTER — Inpatient Hospital Stay (HOSPITAL_COMMUNITY)
Admission: RE | Admit: 2016-12-01 | Discharge: 2016-12-01 | Disposition: A | Discharge status: Home / Self Care | Payer: BLUE CROSS/BLUE SHIELD | Source: Ambulatory Visit

## 2016-12-01 ENCOUNTER — Encounter (HOSPITAL_COMMUNITY)
Admission: AD | Disposition: A | Discharge status: Home / Self Care | Payer: Self-pay | Source: Ambulatory Visit | Attending: Family Medicine

## 2016-12-01 ENCOUNTER — Encounter (HOSPITAL_COMMUNITY): Payer: Self-pay

## 2016-12-01 DIAGNOSIS — O34219 Maternal care for unspecified type scar from previous cesarean delivery: Secondary | ICD-10-CM

## 2016-12-01 DIAGNOSIS — O0930 Supervision of pregnancy with insufficient antenatal care, unspecified trimester: Secondary | ICD-10-CM

## 2016-12-01 DIAGNOSIS — Z3493 Encounter for supervision of normal pregnancy, unspecified, third trimester: Secondary | ICD-10-CM

## 2016-12-01 DIAGNOSIS — Z3A39 39 weeks gestation of pregnancy: Secondary | ICD-10-CM

## 2016-12-01 DIAGNOSIS — O34211 Maternal care for low transverse scar from previous cesarean delivery: Principal | ICD-10-CM | POA: Diagnosis present

## 2016-12-01 DIAGNOSIS — O321XX0 Maternal care for breech presentation, not applicable or unspecified: Secondary | ICD-10-CM | POA: Diagnosis present

## 2016-12-01 DIAGNOSIS — Z98891 History of uterine scar from previous surgery: Secondary | ICD-10-CM

## 2016-12-01 LAB — TYPE AND SCREEN
ABO/RH(D): B POS
Antibody Screen: NEGATIVE

## 2016-12-01 LAB — CBC
HEMATOCRIT: 36 % (ref 36.0–46.0)
Hemoglobin: 12.1 g/dL (ref 12.0–15.0)
MCH: 31.3 pg (ref 26.0–34.0)
MCHC: 33.6 g/dL (ref 30.0–36.0)
MCV: 93 fL (ref 78.0–100.0)
PLATELETS: 186 10*3/uL (ref 150–400)
RBC: 3.87 MIL/uL (ref 3.87–5.11)
RDW: 14.5 % (ref 11.5–15.5)
WBC: 5.1 10*3/uL (ref 4.0–10.5)

## 2016-12-01 LAB — ABO/RH: ABO/RH(D): B POS

## 2016-12-01 SURGERY — Surgical Case
Anesthesia: Spinal | Wound class: Clean Contaminated

## 2016-12-01 MED ORDER — BUPIVACAINE IN DEXTROSE 0.75-8.25 % IT SOLN
INTRATHECAL | Status: AC
  Filled 2016-12-01: qty 2

## 2016-12-01 MED ORDER — IBUPROFEN 600 MG PO TABS
600.0000 mg | ORAL_TABLET | Freq: Four times a day (QID) | ORAL | Status: DC
  Administered 2016-12-01 – 2016-12-04 (×11): 600 mg via ORAL
  Filled 2016-12-01 (×12): qty 1

## 2016-12-01 MED ORDER — ACETAMINOPHEN 160 MG/5ML PO SOLN
975.0000 mg | Freq: Once | ORAL | Status: AC
  Administered 2016-12-01: 975 mg via ORAL
  Filled 2016-12-01: qty 40.6

## 2016-12-01 MED ORDER — MORPHINE SULFATE (PF) 0.5 MG/ML IJ SOLN
INTRAMUSCULAR | Status: AC
  Filled 2016-12-01: qty 10

## 2016-12-01 MED ORDER — LACTATED RINGERS IV SOLN
INTRAVENOUS | Status: DC | PRN
  Administered 2016-12-01: 16:00:00 via INTRAVENOUS

## 2016-12-01 MED ORDER — MEPERIDINE HCL 25 MG/ML IJ SOLN
INTRAMUSCULAR | Status: AC
  Filled 2016-12-01: qty 1

## 2016-12-01 MED ORDER — DIPHENHYDRAMINE HCL 25 MG PO CAPS: 25.0000 mg | ORAL_CAPSULE | Freq: Four times a day (QID) | ORAL | Status: DC | PRN

## 2016-12-01 MED ORDER — LACTATED RINGERS IV SOLN
INTRAVENOUS | Status: DC
  Administered 2016-12-02: 06:00:00 via INTRAVENOUS

## 2016-12-01 MED ORDER — ACETAMINOPHEN 325 MG PO TABS
650.0000 mg | ORAL_TABLET | ORAL | Status: DC | PRN
  Administered 2016-12-03 (×3): 650 mg via ORAL
  Filled 2016-12-01 (×3): qty 2

## 2016-12-01 MED ORDER — OXYTOCIN 40 UNITS IN LACTATED RINGERS INFUSION - SIMPLE MED: 2.5000 [IU]/h | INTRAVENOUS | Status: AC

## 2016-12-01 MED ORDER — PRENATAL MULTIVITAMIN CH
1.0000 | ORAL_TABLET | Freq: Every day | ORAL | Status: DC
  Administered 2016-12-02 – 2016-12-04 (×3): 1 via ORAL
  Filled 2016-12-01 (×3): qty 1

## 2016-12-01 MED ORDER — LACTATED RINGERS IV SOLN
INTRAVENOUS | Status: DC
  Administered 2016-12-01 (×3): via INTRAVENOUS

## 2016-12-01 MED ORDER — PHENYLEPHRINE 8 MG IN D5W 100 ML (0.08MG/ML) PREMIX OPTIME
INJECTION | INTRAVENOUS | Status: AC
  Filled 2016-12-01: qty 100

## 2016-12-01 MED ORDER — DIBUCAINE 1 % RE OINT: 1.0000 "application " | TOPICAL_OINTMENT | RECTAL | Status: DC | PRN

## 2016-12-01 MED ORDER — OXYTOCIN 40 UNITS IN LACTATED RINGERS INFUSION - SIMPLE MED
INTRAVENOUS | Status: DC | PRN
  Administered 2016-12-01: 40 mL via INTRAVENOUS

## 2016-12-01 MED ORDER — FENTANYL CITRATE (PF) 100 MCG/2ML IJ SOLN
INTRAMUSCULAR | Status: DC | PRN
  Administered 2016-12-01: 25 ug via INTRATHECAL

## 2016-12-01 MED ORDER — MORPHINE SULFATE (PF) 0.5 MG/ML IJ SOLN
INTRAMUSCULAR | Status: DC | PRN
  Administered 2016-12-01: .1 mg via INTRATHECAL

## 2016-12-01 MED ORDER — ZOLPIDEM TARTRATE 5 MG PO TABS: 5.0000 mg | ORAL_TABLET | Freq: Every evening | ORAL | Status: DC | PRN

## 2016-12-01 MED ORDER — SCOPOLAMINE 1 MG/3DAYS TD PT72
MEDICATED_PATCH | TRANSDERMAL | Status: AC
  Filled 2016-12-01: qty 1

## 2016-12-01 MED ORDER — OXYCODONE HCL 5 MG PO TABS
5.0000 mg | ORAL_TABLET | ORAL | Status: DC | PRN
  Administered 2016-12-03 – 2016-12-04 (×3): 5 mg via ORAL
  Filled 2016-12-01 (×3): qty 1

## 2016-12-01 MED ORDER — WITCH HAZEL-GLYCERIN EX PADS: 1.0000 "application " | MEDICATED_PAD | CUTANEOUS | Status: DC | PRN

## 2016-12-01 MED ORDER — ONDANSETRON HCL 4 MG/2ML IJ SOLN
INTRAMUSCULAR | Status: DC | PRN
  Administered 2016-12-01: 4 mg via INTRAVENOUS

## 2016-12-01 MED ORDER — ONDANSETRON HCL 4 MG/2ML IJ SOLN
INTRAMUSCULAR | Status: AC
  Filled 2016-12-01: qty 2

## 2016-12-01 MED ORDER — CEFAZOLIN SODIUM-DEXTROSE 2-4 GM/100ML-% IV SOLN
2.0000 g | INTRAVENOUS | Status: AC
  Administered 2016-12-01: 2 g via INTRAVENOUS

## 2016-12-01 MED ORDER — FENTANYL CITRATE (PF) 100 MCG/2ML IJ SOLN
INTRAMUSCULAR | Status: AC
  Filled 2016-12-01: qty 2

## 2016-12-01 MED ORDER — TETANUS-DIPHTH-ACELL PERTUSSIS 5-2.5-18.5 LF-MCG/0.5 IM SUSP: 0.5000 mL | Freq: Once | INTRAMUSCULAR | Status: DC

## 2016-12-01 MED ORDER — BUPIVACAINE IN DEXTROSE 0.75-8.25 % IT SOLN
INTRATHECAL | Status: DC | PRN
  Administered 2016-12-01: 1.3 mL via INTRATHECAL

## 2016-12-01 MED ORDER — DEXAMETHASONE SODIUM PHOSPHATE 4 MG/ML IJ SOLN
INTRAMUSCULAR | Status: DC | PRN
  Administered 2016-12-01: 4 mg via INTRAVENOUS

## 2016-12-01 MED ORDER — OXYTOCIN 10 UNIT/ML IJ SOLN
INTRAMUSCULAR | Status: AC
  Filled 2016-12-01: qty 4

## 2016-12-01 MED ORDER — SIMETHICONE 80 MG PO CHEW: 80.0000 mg | CHEWABLE_TABLET | ORAL | Status: DC | PRN

## 2016-12-01 MED ORDER — PHENYLEPHRINE 40 MCG/ML (10ML) SYRINGE FOR IV PUSH (FOR BLOOD PRESSURE SUPPORT)
PREFILLED_SYRINGE | INTRAVENOUS | Status: AC
  Filled 2016-12-01: qty 10

## 2016-12-01 MED ORDER — DEXAMETHASONE SODIUM PHOSPHATE 4 MG/ML IJ SOLN
INTRAMUSCULAR | Status: AC
  Filled 2016-12-01: qty 1

## 2016-12-01 MED ORDER — SCOPOLAMINE 1 MG/3DAYS TD PT72
MEDICATED_PATCH | TRANSDERMAL | Status: DC | PRN
  Administered 2016-12-01: 1 via TRANSDERMAL

## 2016-12-01 MED ORDER — PHENYLEPHRINE 8 MG IN D5W 100 ML (0.08MG/ML) PREMIX OPTIME
INJECTION | INTRAVENOUS | Status: DC | PRN
  Administered 2016-12-01: 60 ug/min via INTRAVENOUS

## 2016-12-01 MED ORDER — SIMETHICONE 80 MG PO CHEW
80.0000 mg | CHEWABLE_TABLET | Freq: Three times a day (TID) | ORAL | Status: DC
  Administered 2016-12-02 – 2016-12-04 (×8): 80 mg via ORAL
  Filled 2016-12-01 (×8): qty 1

## 2016-12-01 MED ORDER — MENTHOL 3 MG MT LOZG: 1.0000 | LOZENGE | OROMUCOSAL | Status: DC | PRN

## 2016-12-01 MED ORDER — SIMETHICONE 80 MG PO CHEW
80.0000 mg | CHEWABLE_TABLET | ORAL | Status: DC
  Administered 2016-12-01 – 2016-12-03 (×3): 80 mg via ORAL
  Filled 2016-12-01 (×3): qty 1

## 2016-12-01 MED ORDER — SENNOSIDES-DOCUSATE SODIUM 8.6-50 MG PO TABS
2.0000 | ORAL_TABLET | ORAL | Status: DC
  Administered 2016-12-01 – 2016-12-03 (×3): 2 via ORAL
  Filled 2016-12-01 (×3): qty 2

## 2016-12-01 MED ORDER — COCONUT OIL OIL: 1.0000 "application " | TOPICAL_OIL | Status: DC | PRN

## 2016-12-01 MED ORDER — MEPERIDINE HCL 25 MG/ML IJ SOLN
INTRAMUSCULAR | Status: DC | PRN
  Administered 2016-12-01: 12.5 mg via INTRAVENOUS

## 2016-12-01 MED ORDER — HYDROMORPHONE HCL 1 MG/ML IJ SOLN: 0.2500 mg | INTRAMUSCULAR | Status: DC | PRN

## 2016-12-01 MED ORDER — OXYCODONE HCL 5 MG PO TABS: 10.0000 mg | ORAL_TABLET | ORAL | Status: DC | PRN

## 2016-12-01 SURGICAL SUPPLY — 36 items
BENZOIN TINCTURE PRP APPL 2/3 (GAUZE/BANDAGES/DRESSINGS) ×3 IMPLANT
CHLORAPREP W/TINT 26ML (MISCELLANEOUS) ×3 IMPLANT
CLAMP CORD UMBIL (MISCELLANEOUS) IMPLANT
CLOSURE STERI STRIP 1/2 X4 (GAUZE/BANDAGES/DRESSINGS) ×2 IMPLANT
CLOSURE WOUND 1/2 X4 (GAUZE/BANDAGES/DRESSINGS) ×1
CLOTH BEACON ORANGE TIMEOUT ST (SAFETY) ×3 IMPLANT
DRSG OPSITE POSTOP 4X10 (GAUZE/BANDAGES/DRESSINGS) ×3 IMPLANT
ELECT REM PT RETURN 9FT ADLT (ELECTROSURGICAL) ×3
ELECTRODE REM PT RTRN 9FT ADLT (ELECTROSURGICAL) ×1 IMPLANT
EXTRACTOR VACUUM M CUP 4 TUBE (SUCTIONS) IMPLANT
EXTRACTOR VACUUM M CUP 4' TUBE (SUCTIONS)
GLOVE BIOGEL PI IND STRL 7.0 (GLOVE) ×2 IMPLANT
GLOVE BIOGEL PI IND STRL 7.5 (GLOVE) ×2 IMPLANT
GLOVE BIOGEL PI INDICATOR 7.0 (GLOVE) ×4
GLOVE BIOGEL PI INDICATOR 7.5 (GLOVE) ×4
GLOVE ECLIPSE 7.5 STRL STRAW (GLOVE) ×3 IMPLANT
GOWN STRL REUS W/TWL LRG LVL3 (GOWN DISPOSABLE) ×9 IMPLANT
HEMOSTAT ARISTA ABSORB 3G PWDR (MISCELLANEOUS) ×3 IMPLANT
KIT ABG SYR 3ML LUER SLIP (SYRINGE) IMPLANT
NEEDLE HYPO 25X5/8 SAFETYGLIDE (NEEDLE) IMPLANT
NS IRRIG 1000ML POUR BTL (IV SOLUTION) ×3 IMPLANT
PACK C SECTION WH (CUSTOM PROCEDURE TRAY) ×3 IMPLANT
PAD ABD 7.5X8 STRL (GAUZE/BANDAGES/DRESSINGS) ×6 IMPLANT
PAD OB MATERNITY 4.3X12.25 (PERSONAL CARE ITEMS) ×3 IMPLANT
PENCIL SMOKE EVAC W/HOLSTER (ELECTROSURGICAL) ×3 IMPLANT
RTRCTR C-SECT PINK 25CM LRG (MISCELLANEOUS) ×3 IMPLANT
STRIP CLOSURE SKIN 1/2X4 (GAUZE/BANDAGES/DRESSINGS) ×2 IMPLANT
SUT PLAIN 2 0 (SUTURE) ×2
SUT PLAIN ABS 2-0 CT1 27XMFL (SUTURE) ×1 IMPLANT
SUT VIC AB 0 CTX 36 (SUTURE) ×8
SUT VIC AB 0 CTX36XBRD ANBCTRL (SUTURE) ×4 IMPLANT
SUT VIC AB 2-0 CT1 27 (SUTURE) ×2
SUT VIC AB 2-0 CT1 TAPERPNT 27 (SUTURE) ×1 IMPLANT
SUT VIC AB 4-0 KS 27 (SUTURE) ×3 IMPLANT
TOWEL OR 17X24 6PK STRL BLUE (TOWEL DISPOSABLE) ×3 IMPLANT
TRAY FOLEY BAG SILVER LF 14FR (SET/KITS/TRAYS/PACK) ×3 IMPLANT

## 2016-12-01 NOTE — Anesthesia Procedure Notes (Signed)
Spinal  Patient location during procedure: OR Staffing Anesthesiologist: Davell Beckstead Spinal Block Patient position: sitting Prep: DuraPrep Patient monitoring: heart rate, blood pressure and continuous pulse ox Approach: right paramedian Location: L3-4 Injection technique: single-shot Needle Needle type: Sprotte  Needle gauge: 24 G Needle length: 9 cm Assessment Sensory level: T4 Additional Notes Spinal Dosage in OR  .75% Bupivicaine ml       1.4     [id number]   mcg        100    Fentanyl mcg            25      

## 2016-12-01 NOTE — Anesthesia Preprocedure Evaluation (Signed)
Anesthesia Evaluation  Patient identified by MRN, date of birth, ID band Patient awake    Reviewed: Allergy & Precautions, H&P , Patient's Chart, lab work & pertinent test results, reviewed documented beta blocker date and time   Airway Mallampati: II  TM Distance: >3 FB Neck ROM: full    Dental no notable dental hx.    Pulmonary    Pulmonary exam normal breath sounds clear to auscultation       Cardiovascular  Rhythm:regular Rate:Normal     Neuro/Psych    GI/Hepatic   Endo/Other    Renal/GU      Musculoskeletal   Abdominal   Peds  Hematology   Anesthesia Other Findings   Reproductive/Obstetrics                             Anesthesia Physical Anesthesia Plan  ASA: III  Anesthesia Plan: Spinal   Post-op Pain Management:    Induction:   PONV Risk Score and Plan:   Airway Management Planned:   Additional Equipment:   Intra-op Plan:   Post-operative Plan:   Informed Consent: I have reviewed the patients History and Physical, chart, labs and discussed the procedure including the risks, benefits and alternatives for the proposed anesthesia with the patient or authorized representative who has indicated his/her understanding and acceptance.   Dental Advisory Given  Plan Discussed with: CRNA and Surgeon  Anesthesia Plan Comments: (  )        Anesthesia Quick Evaluation  

## 2016-12-01 NOTE — Op Note (Signed)
Cesarean Section Operative Report  Nichole Carolinagnes Ghattas  12/01/2016  Indications: Breech Presentation, history cesarean section x2  Pre-operative Diagnosis: Repeat Cesarean Section  Breech Presentation .   Post-operative Diagnosis: Same   Surgeon: Surgeon(s) and Role:    Levie Heritage* Stinson, Jacob J, DO - Primary   Attending Attestation: I was present and scrubbed for the entire procedure.   Assistants: Shonna ChockNoah Uchenna Rappaport, MD  Anesthesia: spinal    Estimated Blood Loss: 790 ml  Total IV Fluids: 2300 ml LR  Urine Output:: 250 ml dark yellow urine  Specimens: none  Findings: Viable female infant in transverse presentation; Apgars pending; weight pending3= g; arterial cord pH not obtained; clear amniotic fluid; intact placenta with three vessel cord; normal uterus, fallopian tubes and ovaries bilaterally. No significant adhesive disease.  Baby condition / location:  Couplet care / Skin to Skin   Complications: no complications  Indications: Nichole Perkins is a 31 y.o. [phone] with an IUP 6992w1d presenting repeat c/s. Hx c/s x2, breech presentation.  The risks, benefits, complications, treatment options, and exected outcomes were discussed with the patient . The patient dwith the proposed plan, giving informed consent. identified as Nichole CarolinaAgnes Thomann and the procedure verified as C-Section Delivery.  Procedure Details:  The patient was taken back to the operative suite where spinal anesthesia was placed.  A time out was held and the above information confirmed.   After induction of anesthesia, the patient was draped and prepped in the usual sterile manner and placed in a dorsal supine position with a leftward tilt. A Pfannenstiel incision was made and carried down through the subcutaneous tissue to the fascia. Fascial incision was made and sharply extended transversely. The fascia was separated from the underlying rectus tissue superiorly and inferiorly. The peritoneum was identified and sharply entered and  extended longitudinally. Alexis retractor was placed. A low transverse uterine incision was made and extended bluntly. Delivered from breech presentation was a viable infant with Apgars and weight as above.  After waiting 60 seconds for delayed cord cutting, the umbilical cord was clamped and cut cord blood was obtained for evaluation. Cord ph was not sent. The placenta was removed Intact and appeared normal. The uterine outline, tubes and ovaries appeared normal. The uterine incision was closed with running locked sutures of 2-0Vicryl with an imbricating layer of the same.   Hemostasis was observed after placement of one 2-0 vicryl figure-of-eight stitch and Arista hemostatic agent. The peritoneum was closed with 2-0 vicryl. The rectus muscles were examined and hemostasis observed. The fascia was then reapproximated with running sutures of 0Vicryl. The subcuticular closure was performed using 2-0plain gut. The skin was closed with 4-0Vicryl.   Instrument, sponge, and needle counts were correct prior the abdominal closure and were correct at the conclusion of the case.     Disposition: PACU - hemodynamically stable.   Maternal Condition: stable       Signed: Lavonne Chickoah B WoukMD 12/01/2016 4:42 PM

## 2016-12-01 NOTE — Anesthesia Postprocedure Evaluation (Signed)
Anesthesia Post Note  Patient: Nichole Perkins  Procedure(s) Performed: Procedure(s) (LRB): CESAREAN SECTION (N/A)     Patient location during evaluation: PACU Anesthesia Type: Spinal Level of consciousness: oriented and awake and alert Pain management: pain level controlled Vital Signs Assessment: post-procedure vital signs reviewed and stable Respiratory status: spontaneous breathing and respiratory function stable Cardiovascular status: blood pressure returned to baseline and stable Postop Assessment: no headache and no backache Anesthetic complications: no    Last Vitals:  Vitals:   12/01/16 1745 12/01/16 1813  BP: 130/69 (!) 124/54  Pulse: 65 60  Resp: 19 18  Temp: 37.2 C 36.6 C    Last Pain:  Vitals:   12/01/16 1813  TempSrc:   PainSc: 0-No pain   Pain Goal: Patients Stated Pain Goal: 4 (12/01/16 1310)               Lowella CurbWarren Ray Kameko Hukill

## 2016-12-01 NOTE — Telephone Encounter (Signed)
Preadmission screen Preadmission review completed pt aware of NPO status and arrival time of 1:15.

## 2016-12-01 NOTE — H&P (Signed)
LABOR AND DELIVERY ADMISSION HISTORY AND PHYSICAL NOTE  Nichole Perkins is a 31 y.o. female [id number] with IUP at 5616w1d by lmp presenting for repeat cesarean section.   She reports positive fetal movement. She denies leakage of fluid or vaginal bleeding.  Prenatal History/Complications:  Past Medical History: History reviewed. No pertinent past medical history.  Past Surgical History: Past Surgical History:  Procedure Laterality Date  . CESAREAN SECTION    . WRIST SURGERY Left 2010   growth removed    Obstetrical History: OB History    Gravida Para Term Preterm AB Living   4 2 2   1 2    SAB TAB Ectopic Multiple Live Births           2      Social History: Social History   Social History  . Marital status: Married    Spouse name: N/A  . Number of children: N/A  . Years of education: N/A   Social History Main Topics  . Smoking status: Never Smoker  . Smokeless tobacco: Never Used  . Alcohol use No  . Drug use: No  . Sexual activity: Yes    Birth control/ protection: None   Other Topics Concern  . None   Social History Narrative  . None    Family History: Family History  Problem Relation Age of Onset  . Hypertension Mother   . Diabetes Father     Allergies: No Known Allergies  Prescriptions Prior to Admission  Medication Sig Dispense Refill Last Dose  . Prenatal Vit-Fe Fumarate-FA (PRENATAL VITAMINS) 28-0.8 MG TABS Take 1 tablet by mouth daily. 60 tablet 3 Taking     Review of Systems   All systems reviewed and negative except as stated in HPI  Blood pressure (!) 123/48, temperature 99.5 F (37.5 C), temperature source Oral, resp. rate 18, height 5\' 2"  (1.575 m), weight 205 lb (93 kg), last menstrual period 03/02/2016, SpO2 99 %. General appearance: alert, cooperative and appears stated age Lungs: clear to auscultation bilaterally Heart: regular rate and rhythm Abdomen: soft, non-tender; bowel sounds normal Extremities: No calf swelling or  tenderness Presentation: breech, confirmed with u/s     Prenatal labs: ABO, Rh: B/Positive/-- (03/14 1039) Antibody: Negative (03/14 1039) Rubella: immune RPR: Non Reactive (05/17 0835)  HBsAg: Negative (03/14 1039)  HIV:   neg GBS:   neg 1 hr Glucola: wnl Genetic screening:  wnl Anatomy US: wnl  Prenatal Transfer Tool  Maternal Diabetes: No Genetic Screening: Normal Maternal Ultrasounds/Referrals: Normal Fetal Ultrasounds or other Referrals:  None Maternal Substance Abuse:  No Significant Maternal Medications:  None Significant Maternal Lab Results: Lab values include: Group B Strep negative  No results found for this or any previous visit (from the past 24 hour(s)).  Patient Active Problem List   Diagnosis Date Noted  . Breech presentation 11/30/2016  . Supervision of low-risk pregnancy 07/08/2016  . Late prenatal care affecting pregnancy, antepartum 07/08/2016  . Previous cesarean section complicating pregnancy 07/08/2016    Assessment: Nichole Perkins is a 31 y.o. [id number] at 1416w1d here for repeat c/s. Hx c/s x2 in [place] (presumed ltcs). Reports no history of surgical complications. Initial plan for TOLAC, but now breech, electing repeat c/s. The risks of cesarean section were discussed with the patient including but were not limited to: bleeding which may require transfusion or reoperation; infection which may require antibiotics; injury to bowel, bladder, ureters or other surrounding organs; injury to the fetus; need for additional procedures including hysterectomy  in the event of a life-threatening hemorrhage; placental abnormalities wth subsequent pregnancies, incisional problems, thromboembolic phenomenon and other postoperative/anesthesia complications.  The patient concurred with the proposed plan, giving informed written consent for the procedures.  Patient has been NPO since last night. she will remain NPO for procedure. Anesthesia and OR aware.  Preoperative prophylactic  antibiotics and SCDs ordered on call to the OR.  To OR when ready.  #ID:  gbs neg #MOF: breast/bottle #MOC: iud #Circ:  n/a  Noah B Wouk 12/01/2016, 1:22 PM

## 2016-12-01 NOTE — Transfer of Care (Signed)
Immediate Anesthesia Transfer of Care Note  Patient: Nichole Perkins  Procedure(s) Performed: Procedure(s): CESAREAN SECTION (N/A)  Patient Location: PACU  Anesthesia Type:Spinal  Level of Consciousness: awake, alert  and oriented  Airway & Oxygen Therapy: Patient Spontanous Breathing  Post-op Assessment: Report given to RN and Post -op Vital signs reviewed and stable  Post vital signs: Reviewed and stable  Last Vitals:  Vitals:   12/01/16 1310 12/01/16 1324  BP: (!) 123/48 123/63  Pulse:  76  Resp: 18   Temp: 37.5 C     Last Pain:  Vitals:   12/01/16 1310  TempSrc: Oral      Patients Stated Pain Goal: 4 (12/01/16 1310)  Complications: No apparent anesthesia complications

## 2016-12-01 NOTE — Anesthesia Procedure Notes (Deleted)
Spinal

## 2016-12-02 LAB — CBC
HCT: 33.6 % — ABNORMAL LOW (ref 36.0–46.0)
Hemoglobin: 11.4 g/dL — ABNORMAL LOW (ref 12.0–15.0)
MCH: 31.3 pg (ref 26.0–34.0)
MCHC: 33.9 g/dL (ref 30.0–36.0)
MCV: 92.3 fL (ref 78.0–100.0)
Platelets: 169 10*3/uL (ref 150–400)
RBC: 3.64 MIL/uL — ABNORMAL LOW (ref 3.87–5.11)
RDW: 14.4 % (ref 11.5–15.5)
WBC: 7.7 10*3/uL (ref 4.0–10.5)

## 2016-12-02 LAB — RPR: RPR: NONREACTIVE

## 2016-12-02 NOTE — Lactation Note (Signed)
This note was copied from a baby's chart. Lactation Consultation Note  Patient Name: Nichole Perkins ZOXWR'UToday's Date: 12/02/2016 Reason for consult: Initial assessment;Term  Visited with P3 Mom, baby 3418 hrs old.  Mom lying in bed, with baby STS on her breast.  Baby became fussy, and a little nasal congestion noted.  Sat baby up and burped her.  Adjusted some pillows for Mom.  Demonstrated hand expression, colostrum easily expressed.  Baby placed prone on Mom's breast and opened wide and latched on well.  A couple sucks and baby became sleepy.  Mom states baby has been cluster feeding all night.   Encouraged unrestricted STS and latching when baby cues.  Encouraged hand expression and breast massage. Brochure left with Mom.  Informed Mom of IP and OP lactation services available.     Consult Status Consult Status: Follow-up Date: 12/03/16 Follow-up type: In-patient    Nichole Perkins, Nichole Perkins E 12/02/2016, 10:31 AM

## 2016-12-02 NOTE — Progress Notes (Signed)
Subjective: Postpartum Day 1: Cesarean Delivery Patient reports incisional pain.  She just started ambulating this morning. She has not urinated or had flatulence yet.Pain reportedly managable by NSAIDs.  She had concerns about baby feeding ability and breastfeeding.   Objective: Vital signs in last 24 hours: Temp:  [97.8 F (36.6 C)-99.5 F (37.5 C)] 98.2 F (36.8 C) (08/08 0615) Pulse Rate:  [55-76] 55 (08/08 0615) Resp:  [15-19] 18 (08/08 0615) BP: (99-130)/(41-69) 99/41 (08/08 0615) SpO2:  [93 %-100 %] 95 % (08/08 0615) Weight:  [92.5 kg (204 lb)-93 kg (205 lb)] 93 kg (205 lb) (08/07 1310)  Physical Exam:  General: alert, cooperative and no distress Lochia: appropriate Uterine Fundus: firm Incision: no significant drainage, no significant erythema DVT Evaluation: No evidence of DVT seen on physical exam.   Recent Labs  12/01/16 1312 12/02/16 0539  HGB 12.1 11.4*  HCT 36.0 33.6*    Assessment/Plan: Status post Cesarean section. Doing well postoperatively. Patient needs to be able to urinate, have flatus, and ambulate to continue to progress in care.  Continue current care. Patient to see lactation consult today.    Ignacia MarvelKendrick C White 12/02/2016, 7:18 AM   CNM attestation Post Partum Day #1 I have seen and examined this patient and agree with above documentation in the resident's note.   Lester Carolinagnes Canela is a 31 y.o. [phone] s/p rLTCS.  Pt denies problems with ambulating, voiding or po intake. Pain is well controlled.  Plan for birth control is IUD.  Method of Feeding: both  PE:  BP (!) 99/41 (BP Location: Right Arm)   Pulse (!) 55   Temp 98.2 F (36.8 C) (Oral)   Resp 18   Ht 5\' 2"  (1.575 m)   Wt 93 kg (205 lb)   LMP 03/02/2016 (Within Days)   SpO2 95%   Breastfeeding? Unknown   BMI 37.49 kg/m  Fundus firm  Plan for discharge: 12/03/16  Cam HaiSHAW, KIMBERLY, CNM 8:15 AM 12/02/2016

## 2016-12-02 NOTE — Anesthesia Postprocedure Evaluation (Signed)
Anesthesia Post Note  Patient: Lester CarolinaAgnes Gosling  Procedure(s) Performed: Procedure(s) (LRB): CESAREAN SECTION (N/A)     Patient location during evaluation: Mother Baby Anesthesia Type: Spinal Level of consciousness: awake and alert and oriented Pain management: pain level controlled Vital Signs Assessment: post-procedure vital signs reviewed and stable Respiratory status: spontaneous breathing and nonlabored ventilation Cardiovascular status: stable Postop Assessment: no headache, patient able to bend at knees, no backache, no signs of nausea or vomiting, spinal receding and adequate PO intake Anesthetic complications: no    Last Vitals:  Vitals:   12/02/16 0615 12/02/16 0949  BP: (!) 99/41 (!) 101/53  Pulse: (!) 55 64  Resp: 18 18  Temp: 36.8 C 36.6 C    Last Pain:  Vitals:   12/02/16 0949  TempSrc: Oral  PainSc: 0-No pain   Pain Goal: Patients Stated Pain Goal: 4 (12/01/16 1310)               Tramain Gershman Hristova

## 2016-12-03 ENCOUNTER — Encounter (HOSPITAL_COMMUNITY): Payer: Self-pay | Admitting: Family Medicine

## 2016-12-03 NOTE — Progress Notes (Signed)
Attempted to remove pressure dressing but too painful for pt. Encouraged pt to shower this morning to remove pressure dressing.

## 2016-12-03 NOTE — Progress Notes (Signed)
Subjective: Postpartum Day 2: Cesarean Delivery Patient reports tolerating PO, + flatus, + BM and no problems voiding.  Manageable abdominal pain at surgerical site. Infrequently asks for opioid pain medication. She would like to go home.   Objective: Vital signs in last 24 hours: Temp:  [97.6 F (36.4 C)-98.5 F (36.9 C)] 98.5 F (36.9 C) (08/09 0539) Pulse Rate:  [62-69] 62 (08/09 0539) Resp:  [17-18] 17 (08/09 0539) BP: (101-111)/(45-55) 111/55 (08/09 0539) SpO2:  [97 %-98 %] 98 % (08/08 1348)  Physical Exam:  General: alert, cooperative and no distress Lochia: appropriate Uterine Fundus: firm Incision: healing well, Drainage cover between 50-75% of honeycomb guaze DVT Evaluation: No evidence of DVT seen on physical exam.   Recent Labs  12/01/16 1312 12/02/16 0539  HGB 12.1 11.4*  HCT 36.0 33.6*    Assessment/Plan: Status post Cesarean section. Doing well postoperatively.  Discharge home likely with standard precautions and return to clinic in 4-6 weeks.  Nichole Perkins 12/03/2016, 7:51 AM

## 2016-12-03 NOTE — Lactation Note (Signed)
This note was copied from a baby's chart. Lactation Consultation Note  Patient Name: Girl Lester Carolinagnes Toth NWGNF'AToday's Date: 8/9/2018Baby is 41 hours and receiving double phototherapy.  Mom states baby is actively feeding. Discussed and stressed importance of good, frequent feedings to promote voiding and stooling.  Recommended good breast massage and compression during feeding to increase milk flow and intake.  Instructed to feed with cues and to wake baby in addition every 2-3 hours.  Baby voiding well but no stool yet.  Encouraged to call for assist prn.   Maternal Data    Feeding    LATCH Score                   Interventions    Lactation Tools Discussed/Used     Consult Status      Huston FoleyMOULDEN, Amante Fomby S 12/03/2016, 9:05 AM

## 2016-12-03 NOTE — Lactation Note (Signed)
This note was copied from a baby's chart. Lactation Consultation Note  P3, Ex BF.  Baby 49 hours old and on double phototherapy. Baby latched in cradle upon entering w/ lights on.  Baby fell asleep at breast. Suggest unwrapping baby for feedings. Baby re-latched and mother compressed breast during feeding to keep baby active. Intermittent sucks and swallows observed. Discussed jaundiced infant feeding behavior. Set up DEBP with #27 flanges.  Reviewed milk storage and cleaning. Suggest mother post pump 4-5 times per day and give volume pumped back to baby. Discussed spoon and finger syringe feeding. Recommend family call if they need assistance w/ pumping or feeding.    Patient Name: Nichole Perkins ZOXWR'UToday's Date: 12/03/2016 Reason for consult: Follow-up assessment   Maternal Data    Feeding Feeding Type: Breast Fed  LATCH Score Latch: Grasps breast easily, tongue down, lips flanged, rhythmical sucking.  Audible Swallowing: A few with stimulation  Type of Nipple: Everted at rest and after stimulation  Comfort (Breast/Nipple): Soft / non-tender  Hold (Positioning): No assistance needed to correctly position infant at breast.  LATCH Score: 9  Interventions    Lactation Tools Discussed/Used Pump Review: Setup, frequency, and cleaning;Milk Storage Initiated by:: Nichole Byesuth Berkelhammer RN IBCLC Date initiated:: 12/03/16   Consult Status Consult Status: Follow-up Date: 12/04/16 Follow-up type: In-patient    Nichole Perkins, Nichole Perkins 12/03/2016, 5:41 PM

## 2016-12-03 NOTE — Progress Notes (Deleted)
Subjective: Postpartum Day 2: Cesarean Delivery Patient reports incisional pain, tolerating PO, + flatus, + BM and no problems voiding.  Reports little sleep over pain. Has not taken much opioid medication despite severe pain. Has brother with addiction problems and she does not want to try to take them.   Objective: Vital signs in last 24 hours: Temp:  [97.6 F (36.4 C)-98.5 F (36.9 C)] 98.5 F (36.9 C) (08/09 0539) Pulse Rate:  [62-69] 62 (08/09 0539) Resp:  [17-18] 17 (08/09 0539) BP: (101-111)/(45-55) 111/55 (08/09 0539) SpO2:  [97 %-98 %] 98 % (08/08 1348)  Physical Exam:  General: alert, cooperative and mild distress Lochia: appropriate Uterine Fundus: firm, tender  Incision: slight clear drainage present, drainage covers >50 but less<75% of dressing DVT Evaluation: No evidence of DVT seen on physical exam. Abdominal: generalized tenderness throughout. Exquisitely over mid abdomen on deep palpation for uterus.    Recent Labs  12/01/16 1312 12/02/16 0539  HGB 12.1 11.4*  HCT 36.0 33.6*    Assessment/Plan: Status post Cesarean section. Doing well postoperatively.  Continue current care. Patient is resistant to using opioid pain meds due to family hx of addiction.  I explained about in the presence of acute pain something is needed and proper use of opioids does not correlate with addiction behaviors later.    Ignacia MarvelKendrick C Jyaire Koudelka 12/03/2016, 7:29 AM

## 2016-12-04 MED ORDER — SENNOSIDES-DOCUSATE SODIUM 8.6-50 MG PO TABS: 2.0000 | ORAL_TABLET | ORAL | 0 refills | Status: DC

## 2016-12-04 MED ORDER — OXYCODONE HCL 5 MG PO TABS: 5.0000 mg | ORAL_TABLET | ORAL | 0 refills | Status: DC | PRN

## 2016-12-04 MED ORDER — IBUPROFEN 600 MG PO TABS: 600.0000 mg | ORAL_TABLET | Freq: Four times a day (QID) | ORAL | 0 refills | Status: DC

## 2016-12-04 NOTE — Discharge Summary (Signed)
OB Discharge Summary     Patient Name: Nichole Perkins DOB: 1985/06/18 MRN: [id number]  Date of admission: 12/01/2016 Delivering MD: Levie HeritageSTINSON, JACOB J   Date of discharge: 12/04/2016  Admitting diagnosis: Repeat Cesarean Section  Breech Presentation  Intrauterine pregnancy: 5125w1d     Secondary diagnosis:  Active Problems:   Status post repeat low transverse cesarean section      Discharge diagnosis: Term Pregnancy Delivered                                                                                                Post partum procedures:None  Augmentation: None  Complications: None  Hospital course:  Sceduled C/S   31 y.o. yo [id number] at 6125w1d was admitted to the hospital 12/01/2016 for scheduled cesarean section with the following indication:Elective Repeat.  Membrane Rupture Time/Date: 3:49 PM ,12/01/2016   Patient delivered a Viable infant.12/01/2016  Details of operation can be found in separate operative note.  Pateint had an uncomplicated postpartum course.  She is ambulating, tolerating a regular diet, passing flatus, and urinating well. Patient is discharged home in stable condition on  12/04/16         Physical exam  Vitals:   12/02/16 1854 12/03/16 0539 12/03/16 1856 12/04/16 0610  BP: (!) 102/45 (!) 111/55 (!) 104/52 109/62  Pulse: 64 62 65 74  Resp: 18 17 18 18   Temp: 98.4 F (36.9 C) 98.5 F (36.9 C) 98.5 F (36.9 C) 98.9 F (37.2 C)  TempSrc: Oral Oral Oral Oral  SpO2:    99%  Weight:      Height:       General: alert, cooperative and no distress Lochia: appropriate Uterine Fundus: firm Incision: Dressing with mild discharge. No warmth or erythema DVT Evaluation: No evidence of DVT seen on physical exam. Labs: Lab Results  Component Value Date   WBC 7.7 12/02/2016   HGB 11.4 (L) 12/02/2016   HCT 33.6 (L) 12/02/2016   MCV 92.3 12/02/2016   PLT 169 12/02/2016   No flowsheet data found.  Discharge instruction: per After Visit Summary and "Baby and Me  Booklet".  After visit meds:  Allergies as of 12/04/2016   No Known Allergies     Medication List    TAKE these medications   ibuprofen 600 MG tablet Commonly known as:  ADVIL,MOTRIN Take 1 tablet (600 mg total) by mouth every 6 (six) hours.   oxyCODONE 5 MG immediate release tablet Commonly known as:  Oxy IR/ROXICODONE Take 1 tablet (5 mg total) by mouth every 4 (four) hours as needed (pain scale 4-7).   Prenatal Vitamins 28-0.8 MG Tabs Take 1 tablet by mouth daily.   senna-docusate 8.6-50 MG tablet Commonly known as:  Senokot-S Take 2 tablets by mouth daily.       Diet: routine diet  Activity: Advance as tolerated. Pelvic rest for 6 weeks.   Outpatient follow up:6 weeks Follow up Appt:No future appointments. Follow up Visit:No Follow-up on file.  Postpartum contraception: IUD Undecided  Newborn Data: Live born female  Birth Weight: 6 lb 9.5 oz (2990 g) APGAR: 6,  8  Baby Feeding: Bottle and Breast Disposition:home with mother   12/04/2016 John Giovanni, MD  CNM attestation I have seen and examined this patient and agree with above documentation in the resident's note.   Nichole Perkins is a 31 y.o. [phone] s/p rLTCS.   Pain is well controlled.  Plan for birth control is IUD.  Method of Feeding: both  PE:  BP 109/62 (BP Location: Left Arm)   Pulse 74   Temp 98.9 F (37.2 C) (Oral)   Resp 18   Ht 5\' 2"  (1.575 m)   Wt 93 kg (205 lb)   LMP 03/02/2016 (Within Days)   SpO2 99%   Breastfeeding? Unknown   BMI 37.49 kg/m  Fundus firm   Recent Labs  12/02/16 0539  HGB 11.4*  HCT 33.6*     Plan: discharge today - postpartum care discussed - f/u clinic in 4 weeks for postpartum visit   Nichole Perkins, CNM 1:20 PM 12/04/2016

## 2016-12-04 NOTE — Progress Notes (Signed)
CSW received consult due to score of 13 on [place] Depression Screen.   CSW met with MOB, FOB and their two young sons in MOB's first floor room/129 to offer support and provide education education regarding Baby Blues vs PMADs and provided MOB with information about support groups held at [facility].  CSW encouraged MOB to evaluate her mental health throughout the postpartum period with the use of the New Mom Checklist developed by Postpartum Progress and notify a medical professional if symptoms arise.  MOB acknowledges that she feels overwhelmed, but thinks this is normal given two young children and new baby.  She states her husband is supportive.  MOB denies PMADs after births of other babies, but states she felt sad when her first child stayed in the NICU for a period of time after birth.  She states no current concerns and was receptive to information given by CSW.  

## 2016-12-04 NOTE — Discharge Instructions (Signed)

## 2016-12-04 NOTE — Lactation Note (Signed)
This note was copied from a baby's chart. Lactation Consultation Note: Mother breastfed infant for 20 mins. She also pumped with DEBP and obtained 60-70 ml of ebm. Mother is finger feeding infant with a curved tip syringe. Infant remains under double photo tx.  Mother was given a harmony hand pump with a #27 flange. Instructions for use . Observed mother pump 15 ml with hand pump. Mother is active with WIC. She has an appt next month. [facility]. Mothers breast are full. Advised mother to use DEBP again before discharge.  Discussed continued cue base feeding and feed at least 8-12 times in 24 hours. Mother to continue to supplement infant with ebm after breastfeeding. Mother advised to be aware of S/S of Mastitis. Encouraged mother to limit activities and just breastfeed to secure a good milk supply. Will continue to follow up with Mother if she is not discharged today. Mother is aware of available LC services and community support. Mother receptive to all teaching. Denies any concerns or questions.   Patient Name: Nichole Perkins ZOXWR'UToday's Date: 12/04/2016 Reason for consult: Follow-up assessment   Maternal Data    Feeding Feeding Type: Breast Milk Length of feed: 20 min  LATCH Score Latch: Grasps breast easily, tongue down, lips flanged, rhythmical sucking.  Audible Swallowing: A few with stimulation  Type of Nipple: Everted at rest and after stimulation  Comfort (Breast/Nipple): Soft / non-tender  Hold (Positioning): No assistance needed to correctly position infant at breast.  LATCH Score: 9  Interventions    Lactation Tools Discussed/Used     Consult Status      Nichole Perkins, Nichole Perkins 12/04/2016, 9:25 AM

## 2016-12-05 ENCOUNTER — Ambulatory Visit: Payer: Self-pay

## 2016-12-05 NOTE — Lactation Note (Signed)
This note was copied from a baby's chart. Lactation Consultation Note  Baby 92 hours and mother states she is breastfeeding and supplementing after breastfeeding w/ pumped breastmilk. Praised mother for her efforts. Baby cueing.  Mother latched baby in cradle hold. Intermittent swallows observed. Mom encouraged to feed baby 8-12 times/24 hours and with feeding cues.  Discussed pumping in addition to breastfeeding until jaundice resolves. Reviewed engorgement care and monitoring voids/stools.     Patient Name: Nichole Perkins JXBJY'NToday's Date: 12/05/2016 Reason for consult: Follow-up assessment   Maternal Data    Feeding Feeding Type: Breast Fed Length of feed: 15 min  LATCH Score Latch: Grasps breast easily, tongue down, lips flanged, rhythmical sucking.  Audible Swallowing: A few with stimulation  Type of Nipple: Everted at rest and after stimulation  Comfort (Breast/Nipple): Soft / non-tender  Hold (Positioning): No assistance needed to correctly position infant at breast.  LATCH Score: 9  Interventions    Lactation Tools Discussed/Used     Consult Status Consult Status: Complete    Hardie PulleyBerkelhammer, Ruth Boschen 12/05/2016, 12:29 PM

## 2016-12-07 ENCOUNTER — Encounter: Payer: Self-pay | Admitting: General Practice

## 2016-12-31 ENCOUNTER — Ambulatory Visit (INDEPENDENT_AMBULATORY_CARE_PROVIDER_SITE_OTHER): Payer: BLUE CROSS/BLUE SHIELD | Admitting: Obstetrics and Gynecology

## 2016-12-31 ENCOUNTER — Encounter: Payer: Self-pay | Admitting: Obstetrics and Gynecology

## 2016-12-31 VITALS — BP 129/72 | HR 69 | Wt 194.6 lb

## 2016-12-31 DIAGNOSIS — Z1389 Encounter for screening for other disorder: Secondary | ICD-10-CM | POA: Diagnosis not present

## 2016-12-31 DIAGNOSIS — Z3046 Encounter for surveillance of implantable subdermal contraceptive: Secondary | ICD-10-CM

## 2016-12-31 DIAGNOSIS — Z3202 Encounter for pregnancy test, result negative: Secondary | ICD-10-CM

## 2016-12-31 DIAGNOSIS — Z30014 Encounter for initial prescription of intrauterine contraceptive device: Secondary | ICD-10-CM

## 2016-12-31 LAB — POCT PREGNANCY, URINE: Preg Test, Ur: NEGATIVE

## 2016-12-31 MED ORDER — LEVONORGESTREL 18.6 MCG/DAY IU IUD
INTRAUTERINE_SYSTEM | Freq: Once | INTRAUTERINE | Status: AC
Start: 2016-12-31 — End: 2016-12-31
  Administered 2016-12-31: 16:00:00 via INTRAUTERINE

## 2016-12-31 NOTE — Progress Notes (Deleted)
Subjective:     Nichole Perkins is a 31 y.o. female who presents for a postpartum visit. She is 4 weeks postpartum following a low cervical transverse Cesarean section. I have fully reviewed the prenatal and intrapartum course. The delivery was at 39 gestational weeks. Outcome: primary cesarean section, low transverse incision and repeat cesarean section, low transverse incision. Anesthesia: spinal. Postpartum course has been unremarkable. Baby's course has been unremarkable. Baby is feeding by breast. Bleeding thin lochia. Bowel function is normal. Bladder function is normal. Patient is not sexually active. Contraception method is none. Postpartum depression screening: negative.  {Common ambulatory SmartLinks:19316}  Review of Systems {ros; complete:30496}   Objective:    There were no vitals taken for this visit.  General:  {gen appearance:16600}   Breasts:  {breast exam:1202::"inspection negative, no nipple discharge or bleeding, no masses or nodularity palpable"}  Lungs: {lung exam:16931}  Heart:  {heart exam:5510}  Abdomen: {abdomen exam:16834}   Vulva:  {labia exam:12198}  Vagina: {vagina exam:12200}  Cervix:  {cervix exam:14595}  Corpus: {uterus exam:12215}  Adnexa:  {adnexa exam:12223}  Rectal Exam: {rectal/vaginal exam:12274}        Assessment:    *** postpartum exam. Pap smear {done:10129} at today's visit.   Plan:    1. Contraception: {method:5051} 2. *** 3. Follow up in: {1-10:13787} {time; units:19136} or as needed.

## 2017-01-01 NOTE — Patient Instructions (Signed)
IUD AFTERCARE  1.  Uterine cramping is common after IUD placement.  You  May using heating pads, ibuprofen, and tylenol.  If it becomes very painful, you notice fever or chills, unusual bleeding, or foul smelling vaginal discharge please call the office. 2.  Irregular vaginal bleeding is common in the first few months after IUD placement but will often get better in the first six months.   3.  IUDs do not protect against STD such as HIV, genital warts, gonorrhea, chlamydia, herpes.  Please continue to protect yourself against these infections and contact the office if you think you may have contracted an infection. 4.  Checking for proper placement:  You may feel for your IUD strings as shown today by placing you fingers into your vagina.  Do not pull on the strings.  If your Mirena falls out, you can feel the hard plastic part of the IUD, or you can no longer feel the strings, please contact the office. 5.  Your Mirena should be removed or replaced in 5 years. 6.  Please see package insert or [email or web address] for full patient information. 7.  Make follow-up appointment for 4 weeks.  

## 2017-01-01 NOTE — Progress Notes (Signed)
Subjective:     Nichole Perkins is a 31 y.o. female who presents for a postpartum visit. She is 4 weeks postpartum following a low cervical transverse Cesarean section. I have fully reviewed the prenatal and intrapartum course. The delivery was at 39 gestational weeks. Outcome: repeat cesarean section, low transverse incision. Anesthesia: spinal. Postpartum course has been unremarkable. Baby's course has been unremarkable. Baby is feeding by breast. Bleeding thin lochia. Bowel function is normal. Bladder function is normal. Patient is not sexually active. Contraception method is IUD. Postpartum depression screening: negative.  The following portions of the patient's history were reviewed and updated as appropriate: allergies, current medications, past family history, past medical history, past social history, past surgical history and problem list.  Review of Systems Pertinent items are noted in HPI.   Objective:    BP 129/72   Pulse 69   Wt 194 lb 9.6 oz (88.3 kg)   Breastfeeding? Yes   BMI 35.59 kg/m   General:  alert, cooperative and no distress  Lungs: normal work of breathing  Heart:  regular rate and rhythm  Abdomen: soft, non-tender; bowel sounds normal; no masses,  no organomegaly and incision is c/d/i   Vulva:  normal  Vagina: normal vagina, no discharge, exudate, lesion, or erythema  Cervix:  no cervical motion tenderness, no lesions and retroverted  Corpus: normal  Adnexa:  no mass, fullness, tenderness  Rectal Exam: Not performed.        Assessment:     Normal postpartum exam. Pap smear not done at today's visit.   Plan:    1. Contraception: IUD(Liletta) placed at today's visit please see procedure note 2. Continue breast feeding 3. Patient would like to extend FMLA to full 3 months; she will return papers for completion  3. Follow up  as needed.    Caryl AdaJazma Anush Wiedeman, DO OB Fellow Faculty Practice, [facility]   Placement of Lilette IUD  Indication:  Contraception Assisted by: Ammie FerrierJanice Barnes, RN  Patient was counseled regarding the risks/benefits/alternatives of the IUD. Urine pregnancy test negative. Consent was obtained and all questions answered.  Time out performed.  Description: Cervix was swabbed three times with Betadine swabs. Sterile gloves donned. Sterile single-tooth tenaculum used to grasp anterior lip of the cervix and straighten the endocervical canal. Uterus sounded to 6 cm. IUD loaded per manufacturer's instruction and flange set. IUD placed per manufacturer's directions. Strings trimmed. Patient tolerated the procedure well.    Follow-up: Patient counseled regarding techniques for self-monitoring of IUD and given precautions. Patient notified of removal date and given card. Patient will follow-up in 4 weeks for string check.  Caryl AdaJazma Ceaser Ebeling, DO OB Fellow Faculty Practice, [facility]

## 2017-02-11 ENCOUNTER — Ambulatory Visit: Payer: BLUE CROSS/BLUE SHIELD | Admitting: Student

## 2017-05-21 ENCOUNTER — Telehealth: Payer: Self-pay | Admitting: General Practice

## 2017-05-21 NOTE — Telephone Encounter (Signed)
Patient called and left message on nurse line stating she needs to talk to Luna KitchensKathryn Kooistra but she lost her information.

## 2017-05-25 ENCOUNTER — Telehealth: Payer: Self-pay | Admitting: Student

## 2017-05-25 NOTE — Telephone Encounter (Signed)
Left message for patient; she can call me on my cell if she needs to reach me.

## 2018-12-16 ENCOUNTER — Emergency Department (HOSPITAL_COMMUNITY)
Admission: EM | Admit: 2018-12-16 | Discharge: 2018-12-16 | Disposition: A | Discharge status: Home / Self Care | Payer: BC Managed Care – PPO | Attending: Emergency Medicine | Admitting: Emergency Medicine

## 2018-12-16 ENCOUNTER — Encounter (HOSPITAL_COMMUNITY): Payer: Self-pay | Admitting: Emergency Medicine

## 2018-12-16 ENCOUNTER — Other Ambulatory Visit: Payer: Self-pay

## 2018-12-16 DIAGNOSIS — R05 Cough: Secondary | ICD-10-CM | POA: Diagnosis present

## 2018-12-16 DIAGNOSIS — Z20828 Contact with and (suspected) exposure to other viral communicable diseases: Secondary | ICD-10-CM | POA: Diagnosis not present

## 2018-12-16 DIAGNOSIS — R51 Headache: Secondary | ICD-10-CM | POA: Insufficient documentation

## 2018-12-16 DIAGNOSIS — Z79899 Other long term (current) drug therapy: Secondary | ICD-10-CM | POA: Diagnosis not present

## 2018-12-16 DIAGNOSIS — R059 Cough, unspecified: Secondary | ICD-10-CM

## 2018-12-16 LAB — SARS CORONAVIRUS 2 (TAT 6-24 HRS): SARS Coronavirus 2: NEGATIVE

## 2018-12-16 NOTE — ED Notes (Signed)
Pt discharged home per MD order. Discharge summary reviewed with pt, pt verbalizes understanding. Ambulatory off unit. No s/s of distress noted.

## 2018-12-16 NOTE — ED Triage Notes (Addendum)
Patient is complaining of cough for two days. Patient she is having headaches and chills that come and go. She states she is also having a sore and burning throat. Patient states she wants to be tested for covid.

## 2018-12-16 NOTE — Discharge Instructions (Signed)
You can alternate ibuprofen and Tylenol as needed for your headache or fever and chills.  You can take over-the-counter cough medication as needed.  You will be called if your COVID-19 test is positive.  If positive, please isolate at home for 10 days from onset of symptoms and until you have been symptomatic fever free for 3 days.  If negative, you can return once you are symptom-free for 3 days.  Please return the emergency department if you develop any new or worsening symptoms including shortness of breath, severe lightheadedness or passing out, fingers or lips turning blue, or any other new or concerning symptoms.

## 2018-12-16 NOTE — ED Provider Notes (Signed)
[facility]-EMERGENCY DEPT Provider Note   CSN: [id number] Arrival date & time: 12/16/18  [id number]     History   Chief Complaint Chief Complaint  Patient presents with  . Cough  . Headache    HPI Nichole Perkins is a 33 y.o. female who is previously healthy who presents with a 2-day history of intermittent dry cough, intermittent headache and chills.  Patient has had a sore throat and chest pain when she coughs only.  She denies any chest pain at rest.  She denies any shortness of breath abdominal pain, nausea, vomiting, loss of taste or smell.  She reports her work made her come to get a COVID-19 test.  She has not taken any medications at home for symptoms.     HPI  History reviewed. No pertinent past medical history.  Patient Active Problem List   Diagnosis Date Noted  . Status post repeat low transverse cesarean section 12/01/2016  . Breech presentation 11/30/2016  . Supervision of low-risk pregnancy 07/08/2016  . Late prenatal care affecting pregnancy, antepartum 07/08/2016  . Previous cesarean section complicating pregnancy 07/08/2016    Past Surgical History:  Procedure Laterality Date  . CESAREAN SECTION    . CESAREAN SECTION N/A 12/01/2016   Procedure: CESAREAN SECTION;  Surgeon: Levie HeritageStinson, Jacob J, DO;  [facility];  Service: Obstetrics;  Laterality: N/A;  . WRIST SURGERY Left 2010   growth removed     OB History    Gravida  4   Para  3   Term  3   Preterm      AB  1   Living  3     SAB      TAB      Ectopic      Multiple  0   Live Births  3            Home Medications    Prior to Admission medications   Medication Sig Start Date End Date Taking? Authorizing Provider  ibuprofen (ADVIL,MOTRIN) 600 MG tablet Take 1 tablet (600 mg total) by mouth every 6 (six) hours. Patient not taking: Reported on 12/31/2016 12/04/16   Cox, Mariann Lasterorey P, MD  oxyCODONE (OXY IR/ROXICODONE) 5 MG immediate release tablet Take 1 tablet  (5 mg total) by mouth every 4 (four) hours as needed (pain scale 4-7). Patient not taking: Reported on 12/31/2016 12/04/16   Cox, Mariann Lasterorey P, MD  Prenatal Vit-Fe Fumarate-FA (PRENATAL VITAMINS) 28-0.8 MG TABS Take 1 tablet by mouth daily. 07/13/16   Rasch, Victorino DikeJennifer I, NP  senna-docusate (SENOKOT-S) 8.6-50 MG tablet Take 2 tablets by mouth daily. Patient not taking: Reported on 12/31/2016 12/05/16   Cox, Mariann Lasterorey P, MD    Family History Family History  Problem Relation Age of Onset  . Hypertension Mother   . Diabetes Father     Social History Social History   Tobacco Use  . Smoking status: Never Smoker  . Smokeless tobacco: Never Used  Substance Use Topics  . Alcohol use: No  . Drug use: No     Allergies   Patient has no known allergies.   Review of Systems Review of Systems  Constitutional: Positive for chills. Negative for fever.  HENT: Positive for sore throat. Negative for facial swelling.   Respiratory: Positive for cough. Negative for shortness of breath.   Cardiovascular: Positive for chest pain (with cough only).  Gastrointestinal: Negative for abdominal pain, nausea and vomiting.  Genitourinary: Negative for dysuria.  Musculoskeletal: Negative  for back pain.  Skin: Negative for rash and wound.  Neurological: Positive for headaches.  Psychiatric/Behavioral: The patient is not nervous/anxious.      Physical Exam Updated Vital Signs BP (!) 141/63 (BP Location: Right Arm)   Pulse 84   Temp 99 F (37.2 C) (Oral)   Resp 18   Ht 5\' 7"  (1.702 m)   Wt 84.8 kg   LMP 12/12/2018   SpO2 99%   BMI 29.29 kg/m   Physical Exam Vitals signs and nursing note reviewed.  Constitutional:      General: She is not in acute distress.    Appearance: She is well-developed. She is not diaphoretic.  HENT:     Head: Normocephalic and atraumatic.     Right Ear: Tympanic membrane normal.     Left Ear: Tympanic membrane normal.     Mouth/Throat:     Pharynx: No oropharyngeal exudate.   Eyes:     General: No scleral icterus.       Right eye: No discharge.        Left eye: No discharge.     Conjunctiva/sclera: Conjunctivae normal.     Pupils: Pupils are equal, round, and reactive to light.  Neck:     Musculoskeletal: Normal range of motion and neck supple. No neck rigidity.     Thyroid: No thyromegaly.  Cardiovascular:     Rate and Rhythm: Normal rate and regular rhythm.     Heart sounds: Normal heart sounds. No murmur. No friction rub. No gallop.   Pulmonary:     Effort: Pulmonary effort is normal. No respiratory distress.     Breath sounds: Normal breath sounds. No stridor. No wheezing or rales.  Abdominal:     General: Bowel sounds are normal. There is no distension.     Palpations: Abdomen is soft.     Tenderness: There is no abdominal tenderness. There is no guarding or rebound.  Lymphadenopathy:     Cervical: No cervical adenopathy.  Skin:    General: Skin is warm and dry.     Coloration: Skin is not pale.     Findings: No rash.  Neurological:     Mental Status: She is alert.     GCS: GCS eye subscore is 4. GCS verbal subscore is 5. GCS motor subscore is 6.     Coordination: Coordination normal.     Comments: CN 3-12 intact; normal sensation throughout; 5/5 strength in all 4 extremities; equal bilateral grip strength      ED Treatments / Results  Labs (all labs ordered are listed, but only abnormal results are displayed) Labs Reviewed  NOVEL CORONAVIRUS, NAA (HOSPITAL ORDER, SEND-OUT TO REF LAB)    EKG None  Radiology No results found.  Procedures Procedures (including critical care time)  Medications Ordered in ED Medications - No data to display   Initial Impression / Assessment and Plan / ED Course  I have reviewed the triage vital signs and the nursing notes.  Pertinent labs & imaging results that were available during my care of the patient were reviewed by me and considered in my medical decision making (see chart for details).         Patient presenting with upper respiratory symptoms.  Patient's job was concerned that she may have COVID-2 and she was sent for testing.  COVID-19 send out pending.  Home isolation discussed.  Supportive treatment discussed.  Patient offered symptomatic treatment, however she declines.  Advised her she could take over-the-counter  medication as needed.  Return precautions discussed.  Patient understands and agrees with plan.  Patient vital stable throughout ED course and discharged in satisfactory condition.  Nichole Perkins was evaluated in Emergency Department on 12/16/2018 for the symptoms described in the history of present illness. She was evaluated in the context of the global COVID-19 pandemic, which necessitated consideration that the patient might be at risk for infection with the SARS-CoV-2 virus that causes COVID-19. Institutional protocols and algorithms that pertain to the evaluation of patients at risk for COVID-19 are in a state of rapid change based on information released by regulatory bodies including the CDC and federal and state organizations. These policies and algorithms were followed during the patient's care in the ED.  Final Clinical Impressions(s) / ED Diagnoses   Final diagnoses:  Cough    ED Discharge Orders    None       Emi HolesLaw, Efosa Treichler M, PA-C 12/16/18 0743    Gerhard MunchLockwood, Robert, MD 12/16/18 [phone]

## 2018-12-20 ENCOUNTER — Encounter (HOSPITAL_COMMUNITY): Payer: Self-pay

## 2018-12-20 ENCOUNTER — Other Ambulatory Visit: Payer: Self-pay

## 2018-12-20 ENCOUNTER — Emergency Department (HOSPITAL_COMMUNITY)
Admission: EM | Admit: 2018-12-20 | Discharge: 2018-12-20 | Disposition: A | Discharge status: Home / Self Care | Payer: BC Managed Care – PPO | Attending: Emergency Medicine | Admitting: Emergency Medicine

## 2018-12-20 DIAGNOSIS — M7918 Myalgia, other site: Secondary | ICD-10-CM | POA: Diagnosis not present

## 2018-12-20 DIAGNOSIS — R51 Headache: Secondary | ICD-10-CM | POA: Diagnosis present

## 2018-12-20 DIAGNOSIS — B349 Viral infection, unspecified: Secondary | ICD-10-CM | POA: Diagnosis not present

## 2018-12-20 MED ORDER — IBUPROFEN 800 MG PO TABS: 800.0000 mg | ORAL_TABLET | Freq: Three times a day (TID) | ORAL | 0 refills | Status: DC

## 2018-12-20 MED ORDER — PROMETHAZINE HCL 25 MG/ML IJ SOLN
25.0000 mg | Freq: Once | INTRAMUSCULAR | Status: DC
  Filled 2018-12-20: qty 1

## 2018-12-20 MED ORDER — KETOROLAC TROMETHAMINE 60 MG/2ML IM SOLN
30.0000 mg | Freq: Once | INTRAMUSCULAR | Status: AC
  Administered 2018-12-20: 30 mg via INTRAMUSCULAR
  Filled 2018-12-20: qty 2

## 2018-12-20 MED ORDER — METHOCARBAMOL 500 MG PO TABS: 1000.0000 mg | ORAL_TABLET | Freq: Four times a day (QID) | ORAL | 0 refills | Status: DC | PRN

## 2018-12-20 NOTE — ED Triage Notes (Addendum)
C/o intermittent headache x2 days. C/o body aches.   Temp-99.4 oral   Recent covid test negative.

## 2018-12-20 NOTE — ED Provider Notes (Signed)
[facility]-EMERGENCY DEPT Provider Note   CSN: [id number] Arrival date & time: 12/20/18  1626     History   Chief Complaint Chief Complaint  Patient presents with  . Headache  . Generalized Body Aches        HPI   Blood pressure (!) 150/57, pulse (!) 107, temperature 99.4 F (37.4 C), temperature source Oral, resp. rate 16, last menstrual period 12/12/2018, SpO2 100 %, currently breastfeeding.  Nichole Perkins is a 33 y.o. female complaining of headache, myalgia, eyes burning, tactile fever and chills onset 2 days ago.  She was seen for cough last week and had a negative COVID test.  All of her myalgia, headache started after the COVID test and resulted negative.  She had no known sick contacts.  She denies any chest pain, shortness of breath, neck pain, change in vision, dysarthria, ataxia.  History reviewed. No pertinent past medical history.  Patient Active Problem List   Diagnosis Date Noted  . Status post repeat low transverse cesarean section 12/01/2016  . Breech presentation 11/30/2016  . Supervision of low-risk pregnancy 07/08/2016  . Late prenatal care affecting pregnancy, antepartum 07/08/2016  . Previous cesarean section complicating pregnancy 07/08/2016    Past Surgical History:  Procedure Laterality Date  . CESAREAN SECTION    . CESAREAN SECTION N/A 12/01/2016   Procedure: CESAREAN SECTION;  Surgeon: Levie HeritageStinson, Jacob J, DO;  [facility];  Service: Obstetrics;  Laterality: N/A;  . WRIST SURGERY Left 2010   growth removed     OB History    Gravida  4   Para  3   Term  3   Preterm      AB  1   Living  3     SAB      TAB      Ectopic      Multiple  0   Live Births  3            Home Medications    Prior to Admission medications   Medication Sig Start Date End Date Taking? Authorizing Provider  ibuprofen (ADVIL) 800 MG tablet Take 1 tablet (800 mg total) by mouth 3 (three) times daily. 12/20/18    Pailynn Vahey, Joni ReiningNicole, PA-C  methocarbamol (ROBAXIN) 500 MG tablet Take 2 tablets (1,000 mg total) by mouth 4 (four) times daily as needed (Pain). 12/20/18   Carvel Huskins, Joni ReiningNicole, PA-C  oxyCODONE (OXY IR/ROXICODONE) 5 MG immediate release tablet Take 1 tablet (5 mg total) by mouth every 4 (four) hours as needed (pain scale 4-7). Patient not taking: Reported on 12/31/2016 12/04/16   Cox, Mariann Lasterorey P, MD  Prenatal Vit-Fe Fumarate-FA (PRENATAL VITAMINS) 28-0.8 MG TABS Take 1 tablet by mouth daily. 07/13/16   Rasch, Victorino DikeJennifer I, NP  senna-docusate (SENOKOT-S) 8.6-50 MG tablet Take 2 tablets by mouth daily. Patient not taking: Reported on 12/31/2016 12/05/16   Cox, Mariann Lasterorey P, MD    Family History Family History  Problem Relation Age of Onset  . Hypertension Mother   . Diabetes Father     Social History Social History   Tobacco Use  . Smoking status: Never Smoker  . Smokeless tobacco: Never Used  Substance Use Topics  . Alcohol use: No  . Drug use: No     Allergies   Patient has no known allergies.   Review of Systems Review of Systems  A complete review of systems was obtained and all systems are negative except as noted in the HPI and  PMH.     Physical Exam Updated Vital Signs BP (!) 134/58   Pulse (!) 113   Temp 99.2 F (37.3 C) (Oral)   Resp 16   LMP 12/12/2018   SpO2 100%   Physical Exam Vitals signs and nursing note reviewed.  Constitutional:      Appearance: She is well-developed.  HENT:     Head: Normocephalic and atraumatic.  Eyes:     Conjunctiva/sclera: Conjunctivae normal.     Pupils: Pupils are equal, round, and reactive to light.     Comments: No TTP of maxillary or frontal sinuses  No TTP or induration of temporal arteries bilaterally  Neck:     Musculoskeletal: Normal range of motion and neck supple.     Comments: FROM to C-spine. Pt can touch chin to chest without discomfort. No TTP of midline cervical spine.  Cardiovascular:     Rate and Rhythm: Regular rhythm.  Tachycardia present.  Pulmonary:     Effort: Pulmonary effort is normal. No respiratory distress.     Breath sounds: Normal breath sounds. No wheezing or rales.  Chest:     Chest wall: No tenderness.  Abdominal:     General: Bowel sounds are normal.     Palpations: Abdomen is soft.     Tenderness: There is no abdominal tenderness.  Musculoskeletal: Normal range of motion.        General: No tenderness.  Neurological:     Mental Status: She is alert and oriented to person, place, and time.     Cranial Nerves: No cranial nerve deficit.     Comments: II-Visual fields grossly intact. III/IV/VI-Extraocular movements intact.  Pupils reactive bilaterally. V/VII-Smile symmetric, equal eyebrow raise,  facial sensation intact VIII- Hearing grossly intact IX/X-Normal gag XI-bilateral shoulder shrug XII-midline tongue extension Motor: 5/5 bilaterally with normal tone and bulk Cerebellar: Normal finger-to-nose  and normal heel-to-shin test.   Romberg negative Ambulates with a coordinated gait       ED Treatments / Results  Labs (all labs ordered are listed, but only abnormal results are displayed) Labs Reviewed - No data to display  EKG None  Radiology No results found.  Procedures Procedures (including critical care time)  Medications Ordered in ED Medications  promethazine (PHENERGAN) injection 25 mg (25 mg Intramuscular Not Given 12/20/18 1921)  ketorolac (TORADOL) injection 30 mg (30 mg Intramuscular Given 12/20/18 1918)     Initial Impression / Assessment and Plan / ED Course  I have reviewed the triage vital signs and the nursing notes.  Pertinent labs & imaging results that were available during my care of the patient were reviewed by me and considered in my medical decision making (see chart for details).        Vitals:   12/20/18 1642 12/20/18 1930 12/20/18 2000 12/20/18 2007  BP: (!) 150/57 129/69 (!) 134/58   Pulse: (!) 107 (!) 107 (!) 113   Resp: 16   16   Temp: 99.4 F (37.4 C)   99.2 F (37.3 C)  TempSrc: Oral   Oral  SpO2: 100% 100% 100%     Medications  promethazine (PHENERGAN) injection 25 mg (25 mg Intramuscular Not Given 12/20/18 1921)  ketorolac (TORADOL) injection 30 mg (30 mg Intramuscular Given 12/20/18 1918)    Nichole Perkins is 33 y.o. female presenting with headache, myalgia, mildly tachycardic, nontoxic-appearing, no meningeal signs.  Patient given Toradol and Phenergan in clinic, reports mild improvement in symptoms.  Concern for possible COVID but patient declines  retest.  She tested negative last week however the myalgia and headache started a few days ago.  Patient will push fluids, return to ED for any new or worsening symptoms.  This is a shared visit with the attending physician who personally evaluated the patient and agrees with the care plan.   Evaluation does not show pathology that would require ongoing emergent intervention or inpatient treatment. Pt is hemodynamically stable and mentating appropriately. Discussed findings and plan with patient/guardian, who agrees with care plan. All questions answered. Return precautions discussed and outpatient follow up given.      Final Clinical Impressions(s) / ED Diagnoses   Final diagnoses:  Viral illness    ED Discharge Orders         Ordered    ibuprofen (ADVIL) 800 MG tablet  3 times daily     12/20/18 2000    methocarbamol (ROBAXIN) 500 MG tablet  4 times daily PRN     12/20/18 2000           Vauda Salvucci, Charna Elizabeth 12/20/18 2034    Maudie Flakes, MD 12/22/18 [phone]

## 2018-12-20 NOTE — Discharge Instructions (Addendum)
Push fluids, rest, you can take ibuprofen for pain control.  For breakthrough pain you may take Robaxin. Do not drink alcohol, drive or operate heavy machinery when taking Robaxin.  Do not hesitate to return the emergency department for any new, worsening or concerning symptoms.

## 2018-12-26 ENCOUNTER — Ambulatory Visit: Payer: BC Managed Care – PPO | Admitting: Emergency Medicine

## 2018-12-27 ENCOUNTER — Ambulatory Visit (HOSPITAL_COMMUNITY)
Admission: EM | Admit: 2018-12-27 | Discharge: 2018-12-27 | Disposition: A | Discharge status: Home / Self Care | Payer: BC Managed Care – PPO | Attending: Emergency Medicine | Admitting: Emergency Medicine

## 2018-12-27 ENCOUNTER — Encounter (HOSPITAL_COMMUNITY): Payer: Self-pay

## 2018-12-27 ENCOUNTER — Other Ambulatory Visit: Payer: Self-pay

## 2018-12-27 DIAGNOSIS — R110 Nausea: Secondary | ICD-10-CM | POA: Insufficient documentation

## 2018-12-27 DIAGNOSIS — R1084 Generalized abdominal pain: Secondary | ICD-10-CM | POA: Insufficient documentation

## 2018-12-27 DIAGNOSIS — Z3202 Encounter for pregnancy test, result negative: Secondary | ICD-10-CM | POA: Diagnosis not present

## 2018-12-27 LAB — COMPREHENSIVE METABOLIC PANEL
ALT: 30 U/L (ref 0–44)
AST: 25 U/L (ref 15–41)
Albumin: 4.2 g/dL (ref 3.5–5.0)
Alkaline Phosphatase: 53 U/L (ref 38–126)
Anion gap: 8 (ref 5–15)
BUN: 7 mg/dL (ref 6–20)
CO2: 26 mmol/L (ref 22–32)
Calcium: 9.1 mg/dL (ref 8.9–10.3)
Chloride: 105 mmol/L (ref 98–111)
Creatinine, Ser: 0.67 mg/dL (ref 0.44–1.00)
GFR calc Af Amer: >60 mL/min (ref >60)
GFR calc non Af Amer: >60 mL/min (ref >60)
Glucose, Bld: 107 mg/dL — ABNORMAL HIGH (ref 70–99)
Potassium: 4.1 mmol/L (ref 3.5–5.1)
Sodium: 139 mmol/L (ref 135–145)
Total Bilirubin: 0.4 mg/dL (ref 0.3–1.2)
Total Protein: 8 g/dL (ref 6.5–8.1)

## 2018-12-27 LAB — POCT URINALYSIS DIP (DEVICE)
Bilirubin Urine: NEGATIVE
Glucose, UA: NEGATIVE mg/dL
Hgb urine dipstick: NEGATIVE
Ketones, ur: NEGATIVE mg/dL
Leukocytes,Ua: NEGATIVE
Nitrite: NEGATIVE
Protein, ur: NEGATIVE mg/dL
Specific Gravity, Urine: >=1.03 (ref 1.005–1.030)
Urobilinogen, UA: 0.2 mg/dL (ref 0.0–1.0)
pH: 5.5 (ref 5.0–8.0)

## 2018-12-27 LAB — CBC
HCT: 39.7 % (ref 36.0–46.0)
Hemoglobin: 12.9 g/dL (ref 12.0–15.0)
MCH: 30.7 pg (ref 26.0–34.0)
MCHC: 32.5 g/dL (ref 30.0–36.0)
MCV: 94.5 fL (ref 80.0–100.0)
Platelets: 244 10*3/uL (ref 150–400)
RBC: 4.2 MIL/uL (ref 3.87–5.11)
RDW: 12.7 % (ref 11.5–15.5)
WBC: 4.5 10*3/uL (ref 4.0–10.5)
nRBC: 0 % (ref 0.0–0.2)

## 2018-12-27 LAB — POCT PREGNANCY, URINE: Preg Test, Ur: NEGATIVE

## 2018-12-27 LAB — LIPASE, BLOOD: Lipase: 36 U/L (ref 11–51)

## 2018-12-27 MED ORDER — ONDANSETRON HCL 4 MG PO TABS: 4.0000 mg | ORAL_TABLET | Freq: Four times a day (QID) | ORAL | 0 refills | Status: DC

## 2018-12-27 NOTE — ED Triage Notes (Signed)
Pt presents with generalized abdominal pain and nausea x 2 weeks with no relief with OTC medication.

## 2018-12-27 NOTE — ED Provider Notes (Signed)
[facility]    CSN: [id number] Arrival date & time: 12/27/18  1016      History   Chief Complaint Chief Complaint  Patient presents with  . Abdominal Pain  . Nausea    HPI Nichole Perkins is a 33 y.o. female.   Patient presents with epigastric and lower abdominal pain x2 weeks.  She also reports nausea, feeling of fever, and chills.  She states she "just does not feel well".  She was seen at [facility] ED on 12/16/2018; her COVID test was negative at that time.  She was seen again at [facility] ED on 12/19/2018 and diagnosed with a viral illness.  She states she has been taking ibuprofen for her discomfort.  She denies cough, shortness of breath, vomiting, diarrhea, vaginal discharge, pelvic pain, dysuria, back pain.  LMP: 2 weeks.   The history is provided by the patient.    History reviewed. No pertinent past medical history.  Patient Active Problem List   Diagnosis Date Noted  . Status post repeat low transverse cesarean section 12/01/2016  . Breech presentation 11/30/2016  . Supervision of low-risk pregnancy 07/08/2016  . Late prenatal care affecting pregnancy, antepartum 07/08/2016  . Previous cesarean section complicating pregnancy [id number]/94/8546    Past Surgical History:  Procedure Laterality Date  . CESAREAN SECTION    . CESAREAN SECTION N/A 12/01/2016   Procedure: CESAREAN SECTION;  Surgeon: Truett Mainland, DO;  Location: [place];  Service: Obstetrics;  Laterality: N/A;  . WRIST SURGERY Left 2010   growth removed    OB History    Gravida  4   Para  3   Term  3   Preterm      AB  1   Living  3     SAB      TAB      Ectopic      Multiple  0   Live Births  3            Home Medications    Prior to Admission medications   Medication Sig Start Date End Date Taking? Authorizing Provider  ibuprofen (ADVIL) 800 MG tablet Take 1 tablet (800 mg total) by mouth 3 (three) times daily. 12/20/18   Pisciotta, Elmyra Ricks, PA-C   methocarbamol (ROBAXIN) 500 MG tablet Take 2 tablets (1,000 mg total) by mouth 4 (four) times daily as needed (Pain). 12/20/18   Pisciotta, Elmyra Ricks, PA-C  ondansetron (ZOFRAN) 4 MG tablet Take 1 tablet (4 mg total) by mouth every 6 (six) hours. 12/27/18   Sharion Balloon, NP  oxyCODONE (OXY IR/ROXICODONE) 5 MG immediate release tablet Take 1 tablet (5 mg total) by mouth every 4 (four) hours as needed (pain scale 4-7). Patient not taking: Reported on 12/31/2016 12/04/16   Cox, Starlyn Skeans, MD  Prenatal Vit-Fe Fumarate-FA (PRENATAL VITAMINS) 28-0.8 MG TABS Take 1 tablet by mouth daily. 07/13/16   Rasch, Anderson Malta I, NP  senna-docusate (SENOKOT-S) 8.6-50 MG tablet Take 2 tablets by mouth daily. Patient not taking: Reported on 12/31/2016 12/05/16   Cox, Starlyn Skeans, MD    Family History Family History  Problem Relation Age of Onset  . Hypertension Mother   . Diabetes Father     Social History Social History   Tobacco Use  . Smoking status: Never Smoker  . Smokeless tobacco: Never Used  Substance Use Topics  . Alcohol use: No  . Drug use: No     Allergies   Patient has no  known allergies.   Review of Systems Review of Systems  Constitutional: Positive for chills and fever.  HENT: Negative for ear pain and sore throat.   Eyes: Negative for pain and visual disturbance.  Respiratory: Negative for cough and shortness of breath.   Cardiovascular: Negative for chest pain and palpitations.  Gastrointestinal: Positive for abdominal pain and nausea. Negative for diarrhea and vomiting.  Genitourinary: Negative for dysuria, flank pain, hematuria, pelvic pain and vaginal discharge.  Musculoskeletal: Negative for arthralgias and back pain.  Skin: Negative for color change and rash.  Neurological: Negative for seizures and syncope.  All other systems reviewed and are negative.    Physical Exam Triage Vital Signs ED Triage Vitals  Enc Vitals Group     BP      Pulse      Resp      Temp      Temp src       SpO2      Weight      Height      Head Circumference      Peak Flow      Pain Score      Pain Loc      Pain Edu?      Excl. in GC?    No data found.  Updated Vital Signs BP (!) 137/94 (BP Location: Left Arm)   Pulse 71   Temp 98 F (36.7 C) (Oral)   Resp 17   LMP 12/12/2018   SpO2 97%   Visual Acuity Right Eye Distance:   Left Eye Distance:   Bilateral Distance:    Right Eye Near:   Left Eye Near:    Bilateral Near:     Physical Exam Vitals signs and nursing note reviewed.  Constitutional:      General: She is not in acute distress.    Appearance: She is well-developed.  HENT:     Head: Normocephalic and atraumatic.     Right Ear: Tympanic membrane normal.     Left Ear: Tympanic membrane normal.     Nose: Nose normal.     Mouth/Throat:     Mouth: Mucous membranes are moist.     Pharynx: Oropharynx is clear.  Eyes:     Conjunctiva/sclera: Conjunctivae normal.  Neck:     Musculoskeletal: Neck supple.  Cardiovascular:     Rate and Rhythm: Normal rate and regular rhythm.     Heart sounds: No murmur.  Pulmonary:     Effort: Pulmonary effort is normal. No respiratory distress.     Breath sounds: Normal breath sounds.  Abdominal:     General: Bowel sounds are normal.     Palpations: Abdomen is soft.     Tenderness: There is no abdominal tenderness. There is no right CVA tenderness, left CVA tenderness, guarding or rebound.  Skin:    General: Skin is warm and dry.     Capillary Refill: Capillary refill takes less than 2 seconds.     Findings: No rash.  Neurological:     General: No focal deficit present.     Mental Status: She is alert and oriented to person, place, and time.     Sensory: No sensory deficit.     Motor: No weakness.     Coordination: Coordination normal.     Gait: Gait normal.     Deep Tendon Reflexes: Reflexes normal.  Psychiatric:        Mood and Affect: Mood normal.  Behavior: Behavior normal.      UC Treatments /  Results  Labs (all labs ordered are listed, but only abnormal results are displayed) Labs Reviewed  CBC  COMPREHENSIVE METABOLIC PANEL  LIPASE, BLOOD  POC URINE PREG, ED  POCT URINALYSIS DIP (DEVICE)  POCT PREGNANCY, URINE    EKG   Radiology No results found.  Procedures Procedures (including critical care time)  Medications Ordered in UC Medications - No data to display  Initial Impression / Assessment and Plan / UC Course  I have reviewed the triage vital signs and the nursing notes.  Pertinent labs & imaging results that were available during my care of the patient were reviewed by me and considered in my medical decision making (see chart for details).   Abdominal pain, nausea.  Urine pregnancy negative, urine dip normal.  CBC and lipase normal; CMP normal with exception of mildly elevated glucose at 107.  Patient is well-appearing and her exam is unremarkable.  Treating with Zofran.  Instructed patient to stop taking the ibuprofen as it may be upsetting to her stomach.  Instructed her to go to the emergency department if she develops acute worsening abdominal pain, increased nausea or has vomiting, diarrhea, fever, chills, or other concerning symptoms.  Patient agrees with plan of care.     Final Clinical Impressions(s) / UC Diagnoses   Final diagnoses:  Generalized abdominal pain  Nausea     Discharge Instructions     Take the antinausea medication as directed.    Stop taking the ibuprofen as it may be upsetting to your stomach.    Go to the emergency department if you develop acute worsening abdominal pain, increased nausea, vomiting, diarrhea, fever, chills, or other concerning symptoms.    We will call you if your blood work is abnormal.        ED Prescriptions    Medication Sig Dispense Auth. Provider   ondansetron (ZOFRAN) 4 MG tablet Take 1 tablet (4 mg total) by mouth every 6 (six) hours. 12 tablet Mickie Bail, NP     Controlled Substance  Prescriptions [place] Controlled Substance Registry consulted? Not Applicable   Mickie Bail, NP 12/27/18 1316

## 2018-12-27 NOTE — Discharge Instructions (Addendum)
Take the antinausea medication as directed.    Stop taking the ibuprofen as it may be upsetting to your stomach.    Go to the emergency department if you develop acute worsening abdominal pain, increased nausea, vomiting, diarrhea, fever, chills, or other concerning symptoms.    We will call you if your blood work is abnormal.

## 2019-01-02 ENCOUNTER — Telehealth (HOSPITAL_COMMUNITY): Payer: Self-pay | Admitting: Emergency Medicine

## 2019-01-02 NOTE — Telephone Encounter (Signed)
Normal labs. Patient contacted and made aware of    results, all questions answered   

## 2019-01-04 ENCOUNTER — Other Ambulatory Visit: Payer: Self-pay

## 2019-01-04 ENCOUNTER — Encounter: Payer: Self-pay | Admitting: Registered Nurse

## 2019-01-04 ENCOUNTER — Ambulatory Visit: Payer: BC Managed Care – PPO | Admitting: Registered Nurse

## 2019-01-04 VITALS — BP 118/75 | HR 80 | Temp 99.0°F | Resp 16 | Ht 64.57 in | Wt 196.0 lb

## 2019-01-04 DIAGNOSIS — Z23 Encounter for immunization: Secondary | ICD-10-CM

## 2019-01-04 DIAGNOSIS — Z13220 Encounter for screening for lipoid disorders: Secondary | ICD-10-CM | POA: Diagnosis not present

## 2019-01-04 DIAGNOSIS — Z1329 Encounter for screening for other suspected endocrine disorder: Secondary | ICD-10-CM | POA: Diagnosis not present

## 2019-01-04 DIAGNOSIS — Z130 Encounter for screening for diseases of the blood and blood-forming organs and certain disorders involving the immune mechanism: Secondary | ICD-10-CM | POA: Diagnosis not present

## 2019-01-04 DIAGNOSIS — Z13228 Encounter for screening for other metabolic disorders: Secondary | ICD-10-CM | POA: Diagnosis not present

## 2019-01-04 DIAGNOSIS — Z7689 Persons encountering health services in other specified circumstances: Secondary | ICD-10-CM

## 2019-01-04 NOTE — Progress Notes (Signed)
Established Patient Office Visit  Subjective:  Patient ID: Nichole Perkins, female    DOB: 12-Sep-1985  Age: 33 y.o. MRN: [id number]  CC:  Chief Complaint  Patient presents with  . Establish Care    need pcp to manage care     HPI Nichole Perkins presents for visit to establish care She states that she has not received routine care since she gave birth to her third child in 2018 She was recently seen for abdominal pain in urgent care on 12/27/18. Negative findings on labs and exam - discussed with her that it was likely frequent use of ibuprofen. She has since decreased her use and feels somewhat better. She currently has Mirena IUD. Wishes to get this removed. Will return to clinic for removal.   Otherwise, no complaints. Requests full blood work.  Due for pap in March 2021, will schedule CPE for that time.   History reviewed. No pertinent past medical history.  Past Surgical History:  Procedure Laterality Date  . CESAREAN SECTION    . CESAREAN SECTION N/A 12/01/2016   Procedure: CESAREAN SECTION;  Surgeon: Truett Mainland, DO;  Location: [place];  Service: Obstetrics;  Laterality: N/A;  . CESAREAN SECTION    . WRIST SURGERY Left 2010   growth removed    Family History  Problem Relation Age of Onset  . Hypertension Mother   . Diabetes Father   . Hypertension Father   . Hypertension Brother     Social History   Socioeconomic History  . Marital status: Married    Spouse name: Not on file  . Number of children: 3  . Years of education: Not on file  . Highest education level: Not on file  Occupational History  . Not on file  Social Needs  . Financial resource strain: Not hard at all  . Food insecurity    Worry: Never true    Inability: Never true  . Transportation needs    Medical: No    Non-medical: No  Tobacco Use  . Smoking status: Never Smoker  . Smokeless tobacco: Never Used  Substance and Sexual Activity  . Alcohol use: Yes    Comment: occ  . Drug  use: No  . Sexual activity: Yes    Birth control/protection: None, I.U.D.  Lifestyle  . Physical activity    Days per week: 3 days    Minutes per session: 30 min  . Stress: Patient refused  Relationships  . Social Herbalist on phone: Three times a week    Gets together: Twice a week    Attends religious service: More than 4 times per year    Active member of club or organization: Patient refused    Attends meetings of clubs or organizations: Patient refused    Relationship status: Married  . Intimate partner violence    Fear of current or ex partner: No    Emotionally abused: No    Physically abused: No    Forced sexual activity: No  Other Topics Concern  . Not on file  Social History Narrative  . Not on file    Outpatient Medications Prior to Visit  Medication Sig Dispense Refill  . ibuprofen (ADVIL) 800 MG tablet Take 1 tablet (800 mg total) by mouth 3 (three) times daily. 21 tablet 0  . ondansetron (ZOFRAN) 4 MG tablet Take 1 tablet (4 mg total) by mouth every 6 (six) hours. 12 tablet 0  . Prenatal Vit-Fe Fumarate-FA (  PRENATAL VITAMINS) 28-0.8 MG TABS Take 1 tablet by mouth daily. 60 tablet 3  . methocarbamol (ROBAXIN) 500 MG tablet Take 2 tablets (1,000 mg total) by mouth 4 (four) times daily as needed (Pain). 20 tablet 0  . oxyCODONE (OXY IR/ROXICODONE) 5 MG immediate release tablet Take 1 tablet (5 mg total) by mouth every 4 (four) hours as needed (pain scale 4-7). 30 tablet 0  . senna-docusate (SENOKOT-S) 8.6-50 MG tablet Take 2 tablets by mouth daily. 30 tablet 0   No facility-administered medications prior to visit.     No Known Allergies  ROS Review of Systems  Constitutional: Negative.   HENT: Negative.   Eyes: Negative.   Respiratory: Negative.   Cardiovascular: Negative.   Gastrointestinal: Negative.   Endocrine: Negative.   Genitourinary: Negative.   Musculoskeletal: Negative.   Skin: Negative.   Allergic/Immunologic: Negative.    Neurological: Negative.   Hematological: Negative.   Psychiatric/Behavioral: Negative.   All other systems reviewed and are negative.     Objective:    Physical Exam  Constitutional: She is oriented to person, place, and time. She appears well-developed and well-nourished.  Cardiovascular: Normal rate, regular rhythm and normal heart sounds.  Pulmonary/Chest: Breath sounds normal. No respiratory distress.  Neurological: She is oriented to person, place, and time.  Skin: Skin is warm and dry. No rash noted. No erythema. No pallor.  Psychiatric: She has a normal mood and affect. Her behavior is normal. Judgment and thought content normal.  Nursing note and vitals reviewed.   BP 118/75   Pulse 80   Temp 99 F (37.2 C) (Oral)   Resp 16   Ht 5' 4.57" (1.64 m)   Wt 196 lb (88.9 kg)   LMP 12/12/2018   SpO2 100%   BMI 33.06 kg/m  Wt Readings from Last 3 Encounters:  01/04/19 196 lb (88.9 kg)  12/16/18 187 lb (84.8 kg)  12/31/16 194 lb 9.6 oz (88.3 kg)     There are no preventive care reminders to display for this patient.  There are no preventive care reminders to display for this patient.  No results found for: TSH Lab Results  Component Value Date   WBC 4.5 12/27/2018   HGB 12.9 12/27/2018   HCT 39.7 12/27/2018   MCV 94.5 12/27/2018   PLT 244 12/27/2018   Lab Results  Component Value Date   NA 139 12/27/2018   K 4.1 12/27/2018   CO2 26 12/27/2018   GLUCOSE 107 (H) 12/27/2018   BUN 7 12/27/2018   CREATININE 0.67 12/27/2018   BILITOT 0.4 12/27/2018   ALKPHOS 53 12/27/2018   AST 25 12/27/2018   ALT 30 12/27/2018   PROT 8.0 12/27/2018   ALBUMIN 4.2 12/27/2018   CALCIUM 9.1 12/27/2018   ANIONGAP 8 12/27/2018   No results found for: CHOL No results found for: HDL No results found for: LDLCALC No results found for: TRIG No results found for: CHOLHDL Lab Results  Component Value Date   HGBA1C 5.1 07/08/2016      Assessment & Plan:   Problem List  Items Addressed This Visit    None    Visit Diagnoses    Screening for endocrine, metabolic and immunity disorder    -  Primary   Relevant Orders   CBC with Differential/Platelet   Comprehensive metabolic panel   Hemoglobin A1c   TSH   Lipid screening       Relevant Orders   Lipid panel   Flu vaccine need  Relevant Orders   Flu Vaccine QUAD 36+ mos IM (Completed)      No orders of the defined types were placed in this encounter.   Follow-up: Return in about 6 months (around 07/04/2019) for CPE and pap.   PLAN  Labs drawn, will follow up as warranted  Will return as desired for IUD removal  Will return in 6 mos for CPE and pap  Patient encouraged to call clinic with any questions, comments, or concerns.   Janeece Ageeichard Dezarai Prew, NP

## 2019-01-04 NOTE — Patient Instructions (Signed)
° ° ° °  If you have lab work done today you will be contacted with your lab results within the next 2 weeks.  If you have not heard from us then please contact us. The fastest way to get your results is to register for My Chart. ° ° °IF you received an x-ray today, you will receive an invoice from [facility]. Please contact [facility] at [phone] with questions or concerns regarding your invoice.  ° °IF you received labwork today, you will receive an invoice from [facility]. Please contact [facility] at [phone] with questions or concerns regarding your invoice.  ° °Our billing staff will not be able to assist you with questions regarding bills from these companies. ° °You will be contacted with the lab results as soon as they are available. The fastest way to get your results is to activate your My Chart account. Instructions are located on the last page of this paperwork. If you have not heard from us regarding the results in 2 weeks, please contact this office. °  ° ° ° °

## 2019-01-05 LAB — CBC WITH DIFFERENTIAL/PLATELET
Basophils Absolute: 0 10*3/uL (ref 0.0–0.2)
Basos: 1 %
EOS (ABSOLUTE): 0.1 10*3/uL (ref 0.0–0.4)
Eos: 2 %
Hematocrit: 37.3 % (ref 34.0–46.6)
Hemoglobin: 12.1 g/dL (ref 11.1–15.9)
Immature Grans (Abs): 0 10*3/uL (ref 0.0–0.1)
Immature Granulocytes: 0 %
Lymphocytes Absolute: 1.5 10*3/uL (ref 0.7–3.1)
Lymphs: 33 %
MCH: 29.9 pg (ref 26.6–33.0)
MCHC: 32.4 g/dL (ref 31.5–35.7)
MCV: 92 fL (ref 79–97)
Monocytes Absolute: 0.3 10*3/uL (ref 0.1–0.9)
Monocytes: 6 %
Neutrophils Absolute: 2.7 10*3/uL (ref 1.4–7.0)
Neutrophils: 58 %
Platelets: 300 10*3/uL (ref 150–450)
RBC: 4.05 x10E6/uL (ref 3.77–5.28)
RDW: 13 % (ref 11.7–15.4)
WBC: 4.6 10*3/uL (ref 3.4–10.8)

## 2019-01-05 LAB — LIPID PANEL
Chol/HDL Ratio: 3.3 ratio (ref 0.0–4.4)
Cholesterol, Total: 129 mg/dL (ref 100–199)
HDL: 39 mg/dL — ABNORMAL LOW (ref >39)
LDL Chol Calc (NIH): 71 mg/dL (ref 0–99)
Triglycerides: 102 mg/dL (ref 0–149)
VLDL Cholesterol Cal: 19 mg/dL (ref 5–40)

## 2019-01-05 LAB — COMPREHENSIVE METABOLIC PANEL
ALT: 15 IU/L (ref 0–32)
AST: 23 IU/L (ref 0–40)
Albumin/Globulin Ratio: 1.5 (ref 1.2–2.2)
Albumin: 4.5 g/dL (ref 3.8–4.8)
Alkaline Phosphatase: 65 IU/L (ref 39–117)
BUN/Creatinine Ratio: 15 (ref 9–23)
BUN: 12 mg/dL (ref 6–20)
Bilirubin Total: 0.2 mg/dL (ref 0.0–1.2)
CO2: 26 mmol/L (ref 20–29)
Calcium: 9.4 mg/dL (ref 8.7–10.2)
Chloride: 101 mmol/L (ref 96–106)
Creatinine, Ser: 0.8 mg/dL (ref 0.57–1.00)
GFR calc Af Amer: 111 mL/min/{1.73_m2} (ref >59)
GFR calc non Af Amer: 96 mL/min/{1.73_m2} (ref >59)
Globulin, Total: 3 g/dL (ref 1.5–4.5)
Glucose: 90 mg/dL (ref 65–99)
Potassium: 4.1 mmol/L (ref 3.5–5.2)
Sodium: 141 mmol/L (ref 134–144)
Total Protein: 7.5 g/dL (ref 6.0–8.5)

## 2019-01-05 LAB — HEMOGLOBIN A1C
Est. average glucose Bld gHb Est-mCnc: 126 mg/dL
Hgb A1c MFr Bld: 6 % — ABNORMAL HIGH (ref 4.8–5.6)

## 2019-01-05 LAB — TSH: TSH: 0.959 u[IU]/mL (ref 0.450–4.500)

## 2019-01-05 IMAGING — US US MFM OB FOLLOW-UP
1 series · 14 of 28 positions shown · non-contrast
Comparison: none

[Series 1: us mfm ob follow-up · 56 acquisitions, 14 frames shown]
[im 3/56]
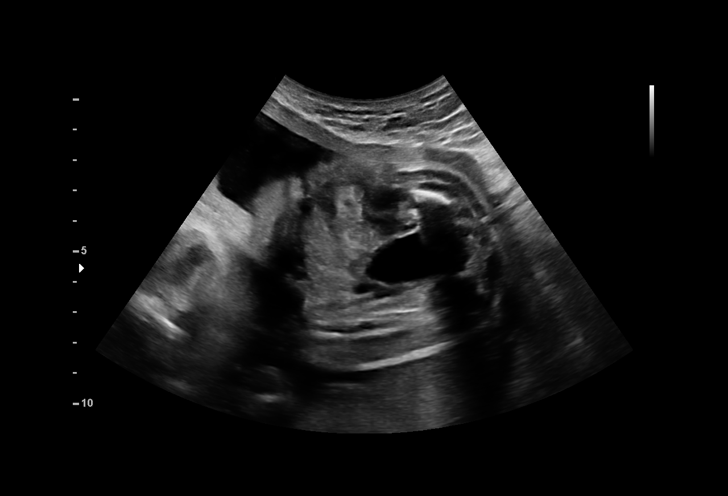
[im 7/56]
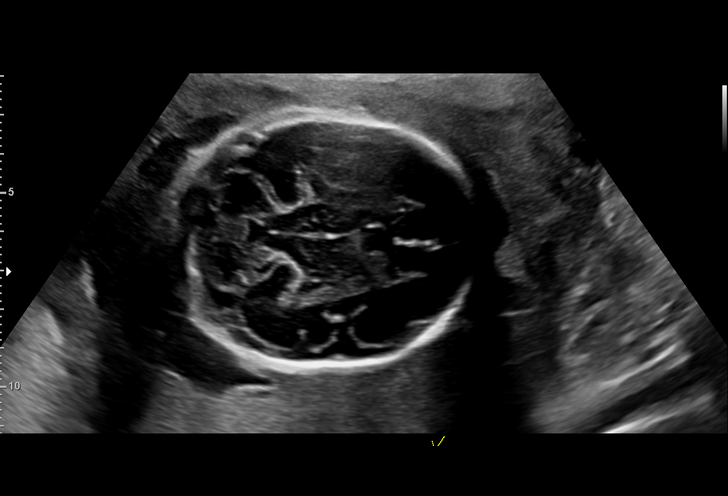
[im 11/56]
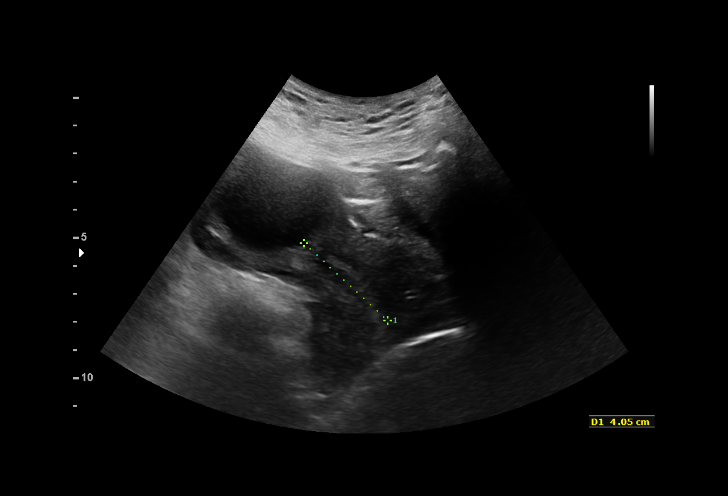
[im 15/56]
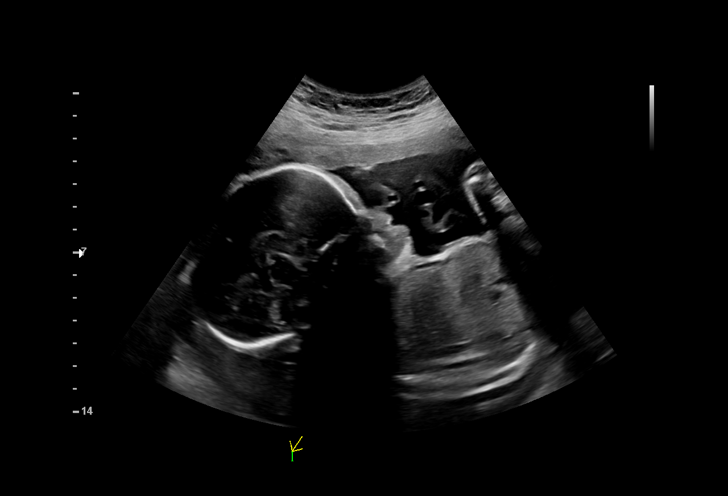
[im 19/56]
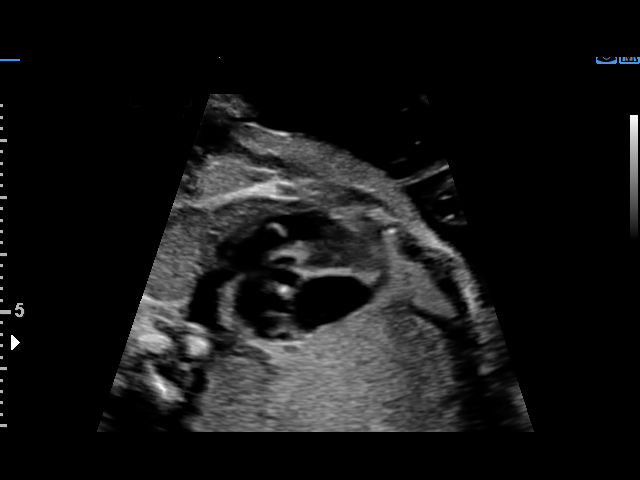
[im 23/56]
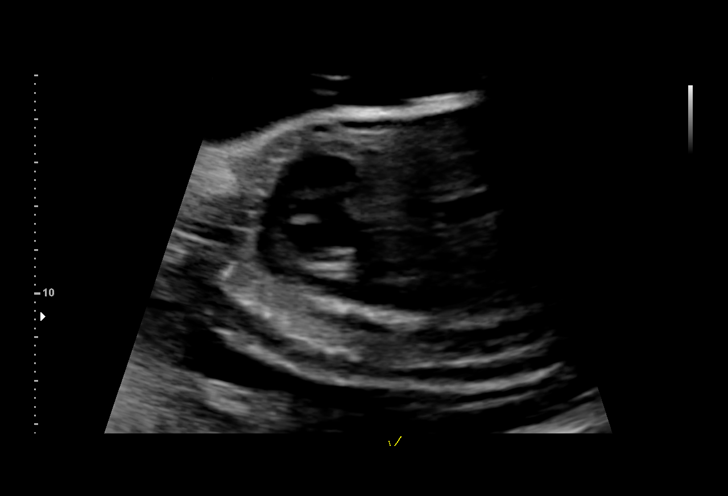
[im 27/56]
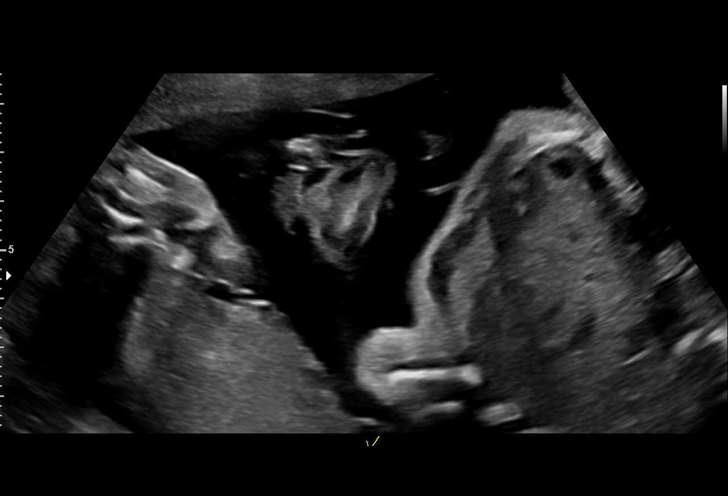
[im 31/56]
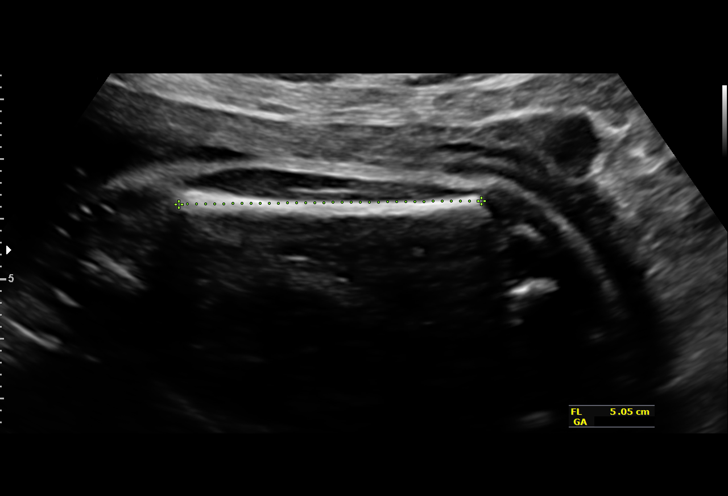
[im 35/56]
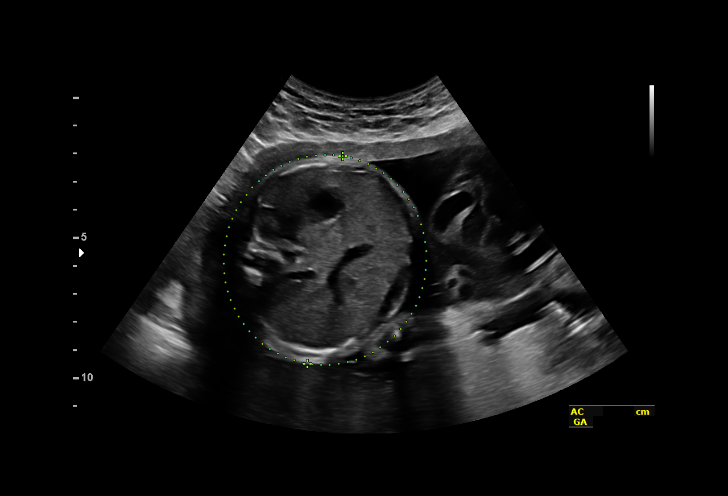
[im 39/56]
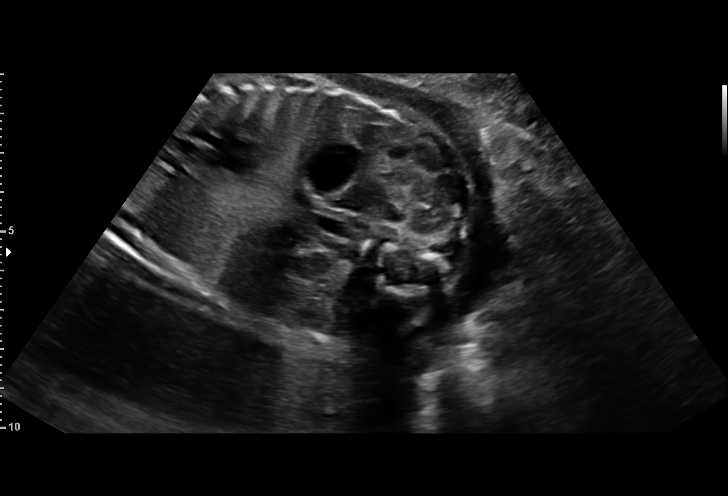
[im 43/56]
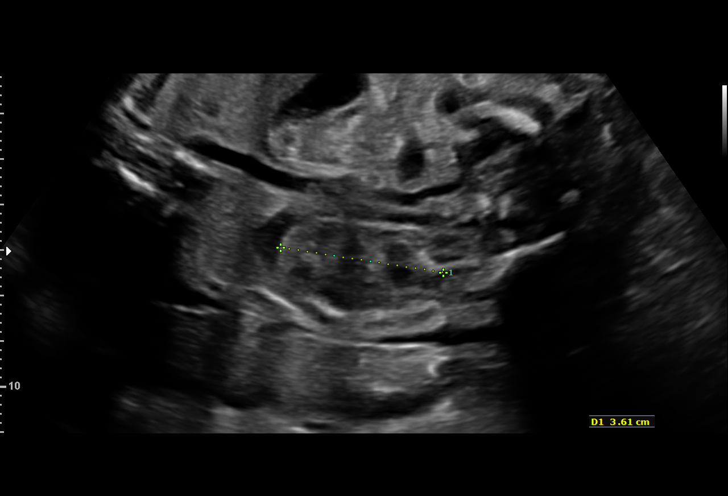
[im 47/56]
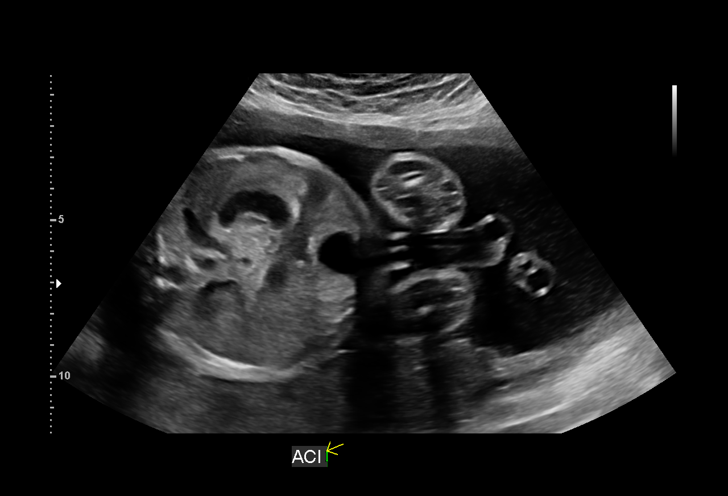
[im 51/56]
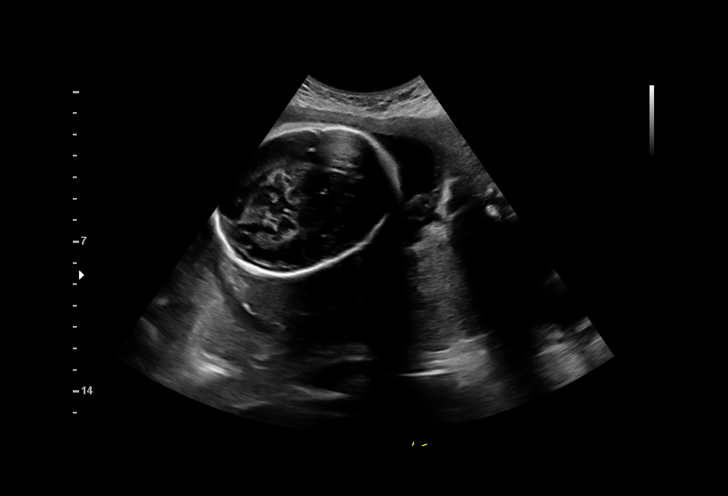
[im 56/56]
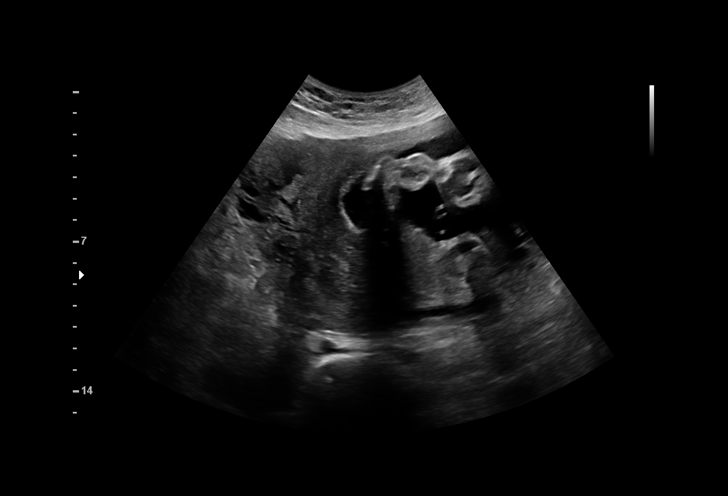

[14 of 28 positions shown; findings below may reference images not displayed]

LAST NP

1  KLPIGBB MOOLMAN         [id number]      [id number]     [id number]
Indications

27 weeks gestation of pregnancy
Previous cesarean delivery, antepartum
Encounter for other antenatal screening
follow-up
Obesity complicating pregnancy, second
trimester
Late prenatal care, second trimester
OB History

Blood Type:            Height:  5'4"   Weight (lb):  192      BMI:
Gravidity:    4         Term:   2        Prem:   0        SAB:   1
TOP:          0       Ectopic:  0        Living: 2
Fetal Evaluation

Num Of Fetuses:     1
Fetal Heart         149
Rate(bpm):
Cardiac Activity:   Observed
Presentation:       Transverse, head to maternal right
Placenta:           Posterior, above cervical os
P. Cord Insertion:  Previously Visualized

Amniotic Fluid
AFI FV:      Subjectively within normal limits

Largest Pocket(cm)
5.08
Biometry
BPD:      64.8  mm     G. Age:  26w 2d         12  %    CI:        70.04   %   70 - 86
FL/HC:      20.6   %   18.6 -
HC:       247   mm     G. Age:  26w 6d         15  %    HC/AC:      1.08       1.05 -
AC:      229.6  mm     G. Age:  27w 3d         48  %    FL/BPD:     78.7   %   71 - 87
FL:         51  mm     G. Age:  27w 2d         41  %    FL/AC:      22.2   %   20 - 24

Est. FW:    2602  gm      2 lb 5 oz     54  %
Gestational Age

LMP:           27w 1d       Date:   03/02/16                 EDD:   12/07/16
U/S Today:     27w 0d                                        EDD:   12/08/16
Best:          27w 1d    Det. By:   LMP  (03/02/16)          EDD:   12/07/16
Anatomy

Cranium:               Appears normal         Aortic Arch:            Appears normal
Cavum:                 Appears normal         Ductal Arch:            Appears normal
Ventricles:            Appears normal         Diaphragm:              Appears normal
Choroid Plexus:        Previously seen        Stomach:                Appears normal, left
sided
Cerebellum:            Appears normal         Abdomen:                Appears normal
Posterior Fossa:       Previously seen        Abdominal Wall:         Appears nml (cord
insert, abd wall)
Nuchal Fold:           Previously seen        Cord Vessels:           Appears normal (3
vessel cord)
Face:                  Orbits nl; profile not Kidneys:                Appear normal
well visualized
Lips:                  Appears normal         Bladder:                Appears normal
Thoracic:              Appears normal         Spine:                  Previously seen
Heart:                 Appears normal         Upper Extremities:      Previously seen
(4CH, axis, and situs
RVOT:                  Appears normal         Lower Extremities:      Previously seen
LVOT:                  Appears normal

Other:  Fetus appears to be a female. Heels previously seen.Nasal bone
previously seen. Technically difficult due to fetal position.
Cervix Uterus Adnexa

Cervix
Length:           4.05  cm.
Normal appearance by transabdominal scan.

Uterus
No abnormality visualized.

Left Ovary
Not visualized.

Right Ovary
Not visualized.

Adnexa:       No abnormality visualized.
Impression

SIUP at 27+1 weeks
Normal interval anatomy; anatomic survey complete except
for the profile
Normal amniotic fluid volume
Appropriate interval growth with EFW at the 54th %tile
Recommendations

Follow-up as clinically indicated

## 2019-01-06 NOTE — Progress Notes (Signed)
Hi,  If you could give Nichole Perkins a call to let her know her labs are all normal except for her A1c - she is in a prediabetic range. She should closely monitor her diet and exercise regimens to bring this number back down. No medication is warranted at this time. We will recheck this again at her next visit. Thank you!  Kathrin Ruddy, NP

## 2019-02-28 ENCOUNTER — Other Ambulatory Visit: Payer: Self-pay

## 2019-02-28 DIAGNOSIS — Z20822 Contact with and (suspected) exposure to covid-19: Secondary | ICD-10-CM

## 2019-03-01 LAB — NOVEL CORONAVIRUS, NAA: SARS-CoV-2, NAA: NOT DETECTED

## 2019-04-10 ENCOUNTER — Other Ambulatory Visit: Payer: Self-pay

## 2019-04-10 ENCOUNTER — Ambulatory Visit
Admission: RE | Admit: 2019-04-10 | Discharge: 2019-04-10 | Disposition: A | Discharge status: Home / Self Care | Payer: BC Managed Care – PPO | Source: Ambulatory Visit | Attending: Internal Medicine | Admitting: Internal Medicine

## 2019-04-10 ENCOUNTER — Other Ambulatory Visit: Payer: Self-pay | Admitting: Internal Medicine

## 2019-04-10 DIAGNOSIS — Z111 Encounter for screening for respiratory tuberculosis: Secondary | ICD-10-CM

## 2019-07-07 ENCOUNTER — Ambulatory Visit (INDEPENDENT_AMBULATORY_CARE_PROVIDER_SITE_OTHER): Payer: BC Managed Care – PPO | Admitting: Registered Nurse

## 2019-07-07 ENCOUNTER — Encounter: Payer: Self-pay | Admitting: Registered Nurse

## 2019-07-07 ENCOUNTER — Other Ambulatory Visit: Payer: Self-pay

## 2019-07-07 VITALS — BP 100/69 | HR 63 | Temp 98.0°F | Ht 64.0 in | Wt 196.0 lb

## 2019-07-07 DIAGNOSIS — Z1329 Encounter for screening for other suspected endocrine disorder: Secondary | ICD-10-CM

## 2019-07-07 DIAGNOSIS — Z13220 Encounter for screening for lipoid disorders: Secondary | ICD-10-CM

## 2019-07-07 DIAGNOSIS — Z130 Encounter for screening for diseases of the blood and blood-forming organs and certain disorders involving the immune mechanism: Secondary | ICD-10-CM

## 2019-07-07 DIAGNOSIS — Z30432 Encounter for removal of intrauterine contraceptive device: Secondary | ICD-10-CM

## 2019-07-07 DIAGNOSIS — Z0000 Encounter for general adult medical examination without abnormal findings: Secondary | ICD-10-CM

## 2019-07-07 DIAGNOSIS — Z13228 Encounter for screening for other metabolic disorders: Secondary | ICD-10-CM

## 2019-07-07 NOTE — Patient Instructions (Addendum)
   If you have lab work done today you will be contacted with your lab results within the next 2 weeks.  If you have not heard from us then please contact us. The fastest way to get your results is to register for My Chart.   IF you received an x-ray today, you will receive an invoice from [facility]. Please contact  Radiology at [phone] with questions or concerns regarding your invoice.   IF you received labwork today, you will receive an invoice from [facility]. Please contact [facility] at [phone] with questions or concerns regarding your invoice.   Our billing staff will not be able to assist you with questions regarding bills from these companies.  You will be contacted with the lab results as soon as they are available. The fastest way to get your results is to activate your My Chart account. Instructions are located on the last page of this paperwork. If you have not heard from us regarding the results in 2 weeks, please contact this office.       Health Maintenance, Female Adopting a healthy lifestyle and getting preventive care are important in promoting health and wellness. Ask your health care provider about:  The right schedule for you to have regular tests and exams.  Things you can do on your own to prevent diseases and keep yourself healthy. What should I know about diet, weight, and exercise? Eat a healthy diet   Eat a diet that includes plenty of vegetables, fruits, low-fat dairy products, and lean protein.  Do not eat a lot of foods that are high in solid fats, added sugars, or sodium. Maintain a healthy weight Body mass index (BMI) is used to identify weight problems. It estimates body fat based on height and weight. Your health care provider can help determine your BMI and help you achieve or maintain a healthy weight. Get regular exercise Get regular exercise. This is one of the most important things you can do for your health. Most  adults should:  Exercise for at least 150 minutes each week. The exercise should increase your heart rate and make you sweat (moderate-intensity exercise).  Do strengthening exercises at least twice a week. This is in addition to the moderate-intensity exercise.  Spend less time sitting. Even light physical activity can be beneficial. Watch cholesterol and blood lipids Have your blood tested for lipids and cholesterol at 35 years of age, then have this test every 5 years. Have your cholesterol levels checked more often if:  Your lipid or cholesterol levels are high.  You are older than 35 years of age.  You are at high risk for heart disease. What should I know about cancer screening? Depending on your health history and family history, you may need to have cancer screening at various ages. This may include screening for:  Breast cancer.  Cervical cancer.  Colorectal cancer.  Skin cancer.  Lung cancer. What should I know about heart disease, diabetes, and high blood pressure? Blood pressure and heart disease  High blood pressure causes heart disease and increases the risk of stroke. This is more likely to develop in people who have high blood pressure readings, are of African descent, or are overweight.  Have your blood pressure checked: ? Every 3-5 years if you are 18-39 years of age. ? Every year if you are 40 years old or older. Diabetes Have regular diabetes screenings. This checks your fasting blood sugar level. Have the screening done:  Once   every three years after age 40 if you are at a normal weight and have a low risk for diabetes.  More often and at a younger age if you are overweight or have a high risk for diabetes. What should I know about preventing infection? Hepatitis B If you have a higher risk for hepatitis B, you should be screened for this virus. Talk with your health care provider to find out if you are at risk for hepatitis B infection. Hepatitis  C Testing is recommended for:  Everyone born from 1945 through 1965.  Anyone with known risk factors for hepatitis C. Sexually transmitted infections (STIs)  Get screened for STIs, including gonorrhea and chlamydia, if: ? You are sexually active and are younger than 35 years of age. ? You are older than 35 years of age and your health care provider tells you that you are at risk for this type of infection. ? Your sexual activity has changed since you were last screened, and you are at increased risk for chlamydia or gonorrhea. Ask your health care provider if you are at risk.  Ask your health care provider about whether you are at high risk for HIV. Your health care provider may recommend a prescription medicine to help prevent HIV infection. If you choose to take medicine to prevent HIV, you should first get tested for HIV. You should then be tested every 3 months for as long as you are taking the medicine. Pregnancy  If you are about to stop having your period (premenopausal) and you may become pregnant, seek counseling before you get pregnant.  Take 400 to 800 micrograms (mcg) of folic acid every day if you become pregnant.  Ask for birth control (contraception) if you want to prevent pregnancy. Osteoporosis and menopause Osteoporosis is a disease in which the bones lose minerals and strength with aging. This can result in bone fractures. If you are 65 years old or older, or if you are at risk for osteoporosis and fractures, ask your health care provider if you should:  Be screened for bone loss.  Take a calcium or vitamin D supplement to lower your risk of fractures.  Be given hormone replacement therapy (HRT) to treat symptoms of menopause. Follow these instructions at home: Lifestyle  Do not use any products that contain nicotine or tobacco, such as cigarettes, e-cigarettes, and chewing tobacco. If you need help quitting, ask your health care provider.  Do not use street  drugs.  Do not share needles.  Ask your health care provider for help if you need support or information about quitting drugs. Alcohol use  Do not drink alcohol if: ? Your health care provider tells you not to drink. ? You are pregnant, may be pregnant, or are planning to become pregnant.  If you drink alcohol: ? Limit how much you use to 0-1 drink a day. ? Limit intake if you are breastfeeding.  Be aware of how much alcohol is in your drink. In the [place] one drink equals one 12 oz bottle of beer (355 mL), one 5 oz glass of wine (148 mL), or one 1 oz glass of hard liquor (44 mL). General instructions  Schedule regular health, dental, and eye exams.  Stay current with your vaccines.  Tell your health care provider if: ? You often feel depressed. ? You have ever been abused or do not feel safe at home. Summary  Adopting a healthy lifestyle and getting preventive care are important in promoting health and   wellness.  Follow your health care provider's instructions about healthy diet, exercising, and getting tested or screened for diseases.  Follow your health care provider's instructions on monitoring your cholesterol and blood pressure. This information is not intended to replace advice given to you by your health care provider. Make sure you discuss any questions you have with your health care provider. Document Revised: 04/06/2018 Document Reviewed: 04/06/2018 Elsevier Patient Education  2020 [employer]     Why follow it? Research shows. . Those who follow the Mediterranean diet have a reduced risk of heart disease  . The diet is associated with a reduced incidence of Parkinson's and Alzheimer's diseases . People following the diet may have longer life expectancies and lower rates of chronic diseases  . The Dietary Guidelines for Americans recommends the Mediterranean diet as an eating plan to promote health and prevent disease  What Is the Mediterranean Diet?   . Healthy eating plan based on typical foods and recipes of Mediterranean-style cooking . The diet is primarily a plant based diet; these foods should make up a majority of meals   Starches - Plant based foods should make up a majority of meals - They are an important sources of vitamins, minerals, energy, antioxidants, and fiber - Choose whole grains, foods high in fiber and minimally processed items  - Typical grain sources include wheat, oats, barley, corn, brown rice, bulgar, farro, millet, polenta, couscous  - Various types of beans include chickpeas, lentils, fava beans, black beans, white beans   Fruits  Veggies - Large quantities of antioxidant rich fruits & veggies; 6 or more servings  - Vegetables can be eaten raw or lightly drizzled with oil and cooked  - Vegetables common to the traditional Mediterranean Diet include: artichokes, arugula, beets, broccoli, brussel sprouts, cabbage, carrots, celery, collard greens, cucumbers, eggplant, kale, leeks, lemons, lettuce, mushrooms, okra, onions, peas, peppers, potatoes, pumpkin, radishes, rutabaga, shallots, spinach, sweet potatoes, turnips, zucchini - Fruits common to the Mediterranean Diet include: apples, apricots, avocados, cherries, clementines, dates, figs, grapefruits, grapes, melons, nectarines, oranges, peaches, pears, pomegranates, strawberries, tangerines  Fats - Replace butter and margarine with healthy oils, such as olive oil, canola oil, and tahini  - Limit nuts to no more than a handful a day  - Nuts include walnuts, almonds, pecans, pistachios, pine nuts  - Limit or avoid candied, honey roasted or heavily salted nuts - Olives are central to the Mediterranean diet - can be eaten whole or used in a variety of dishes   Meats Protein - Limiting red meat: no more than a few times a month - When eating red meat: choose lean cuts and keep the portion to the size of deck of cards - Eggs: approx. 0 to 4 times a week  - Fish and lean  poultry: at least 2 a week  - Healthy protein sources include, chicken, turkey, lean beef, lamb - Increase intake of seafood such as tuna, salmon, trout, mackerel, shrimp, scallops - Avoid or limit high fat processed meats such as sausage and bacon  Dairy - Include moderate amounts of low fat dairy products  - Focus on healthy dairy such as fat free yogurt, skim milk, low or reduced fat cheese - Limit dairy products higher in fat such as whole or 2% milk, cheese, ice cream  Alcohol - Moderate amounts of red wine is ok  - No more than 5 oz daily for women (all ages) and men older than age 65  - No   more than 10 oz of wine daily for men younger than 65  Other - Limit sweets and other desserts  - Use herbs and spices instead of salt to flavor foods  - Herbs and spices common to the traditional Mediterranean Diet include: basil, bay leaves, chives, cloves, cumin, fennel, garlic, lavender, marjoram, mint, oregano, parsley, pepper, rosemary, sage, savory, sumac, tarragon, thyme   It's not just a diet, it's a lifestyle:  . The Mediterranean diet includes lifestyle factors typical of those in the region  . Foods, drinks and meals are best eaten with others and savored . Daily physical activity is important for overall good health . This could be strenuous exercise like running and aerobics . This could also be more leisurely activities such as walking, housework, yard-work, or taking the stairs . Moderation is the key; a balanced and healthy diet accommodates most foods and drinks . Consider portion sizes and frequency of consumption of certain foods   Meal Ideas & Options:  . Breakfast:  o Whole wheat toast or whole wheat English muffins with peanut butter & hard boiled egg o Steel cut oats topped with apples & cinnamon and skim milk  o Fresh fruit: banana, strawberries, melon, berries, peaches  o Smoothies: strawberries, bananas, greek yogurt, peanut butter o Low fat greek yogurt with  blueberries and granola  o Egg white omelet with spinach and mushrooms o Breakfast couscous: whole wheat couscous, apricots, skim milk, cranberries  . Sandwiches:  o Hummus and grilled vegetables (peppers, zucchini, squash) on whole wheat bread   o Grilled chicken on whole wheat pita with lettuce, tomatoes, cucumbers or tzatziki  o Tuna salad on whole wheat bread: tuna salad made with greek yogurt, olives, red peppers, capers, green onions o Garlic rosemary lamb pita: lamb sauted with garlic, rosemary, salt & pepper; add lettuce, cucumber, greek yogurt to pita - flavor with lemon juice and black pepper  . Seafood:  o Mediterranean grilled salmon, seasoned with garlic, basil, parsley, lemon juice and black pepper o Shrimp, lemon, and spinach whole-grain pasta salad made with low fat greek yogurt  o Seared scallops with lemon orzo  o Seared tuna steaks seasoned salt, pepper, coriander topped with tomato mixture of olives, tomatoes, olive oil, minced garlic, parsley, green onions and cappers  . Meats:  o Herbed greek chicken salad with kalamata olives, cucumber, feta  o Red bell peppers stuffed with spinach, bulgur, lean ground beef (or lentils) & topped with feta   o Kebabs: skewers of chicken, tomatoes, onions, zucchini, squash  o Turkey burgers: made with red onions, mint, dill, lemon juice, feta cheese topped with roasted red peppers . Vegetarian o Cucumber salad: cucumbers, artichoke hearts, celery, red onion, feta cheese, tossed in olive oil & lemon juice  o Hummus and whole grain pita points with a greek salad (lettuce, tomato, feta, olives, cucumbers, red onion) o Lentil soup with celery, carrots made with vegetable broth, garlic, salt and pepper  o Tabouli salad: parsley, bulgur, mint, scallions, cucumbers, tomato, radishes, lemon juice, olive oil, salt and pepper.       Fat and Cholesterol Restricted Eating Plan Eating a diet that limits fat and cholesterol may help lower your risk  for heart disease and other conditions. Your body needs fat and cholesterol for basic functions, but eating too much of these things can be harmful to your health. Your health care provider may order lab tests to check your blood fat (lipid) and cholesterol levels. This helps your health   care provider understand your risk for certain conditions and whether you need to make diet changes. Work with your health care provider or dietitian to make an eating plan that is right for you. Your plan includes:  Limit your fat intake to ______% or less of your total calories a day.  Limit your saturated fat intake to ______% or less of your total calories a day.  Limit the amount of cholesterol in your diet to less than _________mg a day.  Eat ___________ g of fiber a day. What are tips for following this plan? General guidelines   If you are overweight, work with your health care provider to lose weight safely. Losing just 5-10% of your body weight can improve your overall health and help prevent diseases such as diabetes and heart disease.  Avoid: ? Foods with added sugar. ? Fried foods. ? Foods that contain partially hydrogenated oils, including stick margarine, some tub margarines, cookies, crackers, and other baked goods.  Limit alcohol intake to no more than 1 drink a day for nonpregnant women and 2 drinks a day for men. One drink equals 12 oz of beer, 5 oz of wine, or 1 oz of hard liquor. Reading food labels  Check food labels for: ? Trans fats, partially hydrogenated oils, or high amounts of saturated fat. Avoid foods that contain saturated fat and trans fat. ? The amount of cholesterol in each serving. Try to eat no more than 200 mg of cholesterol each day. ? The amount of fiber in each serving. Try to eat at least 20-30 g of fiber each day.  Choose foods with healthy fats, such as: ? Monounsaturated and polyunsaturated fats. These include olive and canola oil, flaxseeds, walnuts,  almonds, and seeds. ? Omega-3 fats. These are found in foods such as salmon, mackerel, sardines, tuna, flaxseed oil, and ground flaxseeds.  Choose grain products that have whole grains. Look for the word "whole" as the first word in the ingredient list. Cooking  Cook foods using methods other than frying. Baking, boiling, grilling, and broiling are some healthy options.  Eat more home-cooked food and less restaurant, buffet, and fast food.  Avoid cooking using saturated fats. ? Animal sources of saturated fats include meats, butter, and cream. ? Plant sources of saturated fats include palm oil, palm kernel oil, and coconut oil. Meal planning   At meals, imagine dividing your plate into fourths: ? Fill one-half of your plate with vegetables and green salads. ? Fill one-fourth of your plate with whole grains. ? Fill one-fourth of your plate with lean protein foods.  Eat fish that is high in omega-3 fats at least two times a week.  Eat more foods that contain fiber, such as whole grains, beans, apples, broccoli, carrots, peas, and barley. These foods help promote healthy cholesterol levels in the blood. Recommended foods Grains  Whole grains, such as whole wheat or whole grain breads, crackers, cereals, and pasta. Unsweetened oatmeal, bulgur, barley, quinoa, or brown rice. Corn or whole wheat flour tortillas. Vegetables  Fresh or frozen vegetables (raw, steamed, roasted, or grilled). Green salads. Fruits  All fresh, canned (in natural juice), or frozen fruits. Meats and other protein foods  Ground beef (85% or leaner), grass-fed beef, or beef trimmed of fat. Skinless chicken or turkey. Ground chicken or turkey. Pork trimmed of fat. All fish and seafood. Egg whites. Dried beans, peas, or lentils. Unsalted nuts or seeds. Unsalted canned beans. Natural nut butters without added sugar and oil. Dairy    Low-fat or nonfat dairy products, such as skim or 1% milk, 2% or reduced-fat cheeses,  low-fat and fat-free ricotta or cottage cheese, or plain low-fat and nonfat yogurt. Fats and oils  Tub margarine without trans fats. Light or reduced-fat mayonnaise and salad dressings. Avocado. Olive, canola, sesame, or safflower oils. The items listed above may not be a complete list of recommended foods or beverages. Contact your dietitian for more options. Foods to avoid Grains  White bread. White pasta. White rice. Cornbread. Bagels, pastries, and croissants. Crackers and snack foods that contain trans fat and hydrogenated oils. Vegetables  Vegetables cooked in cheese, cream, or butter sauce. Fried vegetables. Fruits  Canned fruit in heavy syrup. Fruit in cream or butter sauce. Fried fruit. Meats and other protein foods  Fatty cuts of meat. Ribs, chicken wings, bacon, sausage, bologna, salami, chitterlings, fatback, hot dogs, bratwurst, and packaged lunch meats. Liver and organ meats. Whole eggs and egg yolks. Chicken and turkey with skin. Fried meat. Dairy  Whole or 2% milk, cream, half-and-half, and cream cheese. Whole milk cheeses. Whole-fat or sweetened yogurt. Full-fat cheeses. Nondairy creamers and whipped toppings. Processed cheese, cheese spreads, and cheese curds. Beverages  Alcohol. Sugar-sweetened drinks such as sodas, lemonade, and fruit drinks. Fats and oils  Butter, stick margarine, lard, shortening, ghee, or bacon fat. Coconut, palm kernel, and palm oils. Sweets and desserts  Corn syrup, sugars, honey, and molasses. Candy. Jam and jelly. Syrup. Sweetened cereals. Cookies, pies, cakes, donuts, muffins, and ice cream. The items listed above may not be a complete list of foods and beverages to avoid. Contact your dietitian for more information. Summary  Your body needs fat and cholesterol for basic functions. However, eating too much of these things can be harmful to your health.  Work with your health care provider and dietitian to follow a diet low in fat and  cholesterol. Doing this may help lower your risk for heart disease and other conditions.  Choose healthy fats, such as monounsaturated and polyunsaturated fats, and foods high in omega-3 fatty acids.  Eat fiber-rich foods, such as whole grains, beans, peas, fruits, and vegetables.  Limit or avoid alcohol, fried foods, and foods high in saturated fats, partially hydrogenated oils, and sugar. This information is not intended to replace advice given to you by your health care provider. Make sure you discuss any questions you have with your health care provider. Document Revised: 03/26/2017 Document Reviewed: 12/29/2016 Elsevier Patient Education  2020 [employer].  American Heart Association (AHA) Exercise Recommendation  Being physically active is important to prevent heart disease and stroke, the nation's No. 1and No. 5killers. To improve overall cardiovascular health, we suggest at least 150 minutes per week of moderate exercise or 75 minutes per week of vigorous exercise (or a combination of moderate and vigorous activity). Thirty minutes a day, five times a week is an easy goal to remember. You will also experience benefits even if you divide your time into two or three segments of 10 to 15 minutes per day.  For people who would benefit from lowering their blood pressure or cholesterol, we recommend 40 minutes of aerobic exercise of moderate to vigorous intensity three to four times a week to lower the risk for heart attack and stroke.  Physical activity is anything that makes you move your body and burn calories.  This includes things like climbing stairs or playing sports. Aerobic exercises benefit your heart, and include walking, jogging, swimming or biking. Strength and stretching exercises are   best for overall stamina and flexibility.  The simplest, positive change you can make to effectively improve your heart health is to start walking. It's enjoyable, free, easy, social and great  exercise. A walking program is flexible and boasts high success rates because people can stick with it. It's easy for walking to become a regular and satisfying part of life.   For Overall Cardiovascular Health:  At least 30 minutes of moderate-intensity aerobic activity at least 5 days per week for a total of 150  OR   At least 25 minutes of vigorous aerobic activity at least 3 days per week for a total of 75 minutes; or a combination of moderate- and vigorous-intensity aerobic activity  AND   Moderate- to high-intensity muscle-strengthening activity at least 2 days per week for additional health benefits.  For Lowering Blood Pressure and Cholesterol  An average 40 minutes of moderate- to vigorous-intensity aerobic activity 3 or 4 times per week  What if I can't make it to the time goal? Something is always better than nothing! And everyone has to start somewhere. Even if you've been sedentary for years, today is the day you can begin to make healthy changes in your life. If you don't think you'll make it for 30 or 40 minutes, set a reachable goal for today. You can work up toward your overall goal by increasing your time as you get stronger. Don't let all-or-nothing thinking rob you of doing what you can every day.  Source:http://www.heart.org    

## 2019-07-07 NOTE — Progress Notes (Signed)
Established Patient Office Visit  Subjective:  Patient ID: Nichole Perkins, female    DOB: 05/14/1985  Age: 34 y.o. MRN: [id number]  CC:  Chief Complaint  Patient presents with  . Annual Exam    physical     HPI Nichole Perkins presents for CPE and IUD removal  Having some int pelvic pain. IUD ins 3 years ago at 6w post partum visit - states that it has worked well but desiring removal. Pt is catholic, does not want to be on contraception.  Otherwise, no major concerns today.   No past medical history on file.  Past Surgical History:  Procedure Laterality Date  . CESAREAN SECTION    . CESAREAN SECTION N/A 12/01/2016   Procedure: CESAREAN SECTION;  Surgeon: Levie Heritage, DO;  Location: [facility] BIRTHING SUIT[facility];  Service: Obstetrics;  Laterality: N/A;  . CESAREAN SECTION    . WRIST SURGERY Left 2010   growth removed    Family History  Problem Relation Age of Onset  . Hypertension Mother   . Diabetes Father   . Hypertension Father   . Hypertension Brother     Social History   Socioeconomic History  . Marital status: Married    Spouse name: Not on file  . Number of children: 3  . Years of education: Not on file  . Highest education level: Not on file  Occupational History  . Not on file  Tobacco Use  . Smoking status: Never Smoker  . Smokeless tobacco: Never Used  Substance and Sexual Activity  . Alcohol use: Yes    Comment: occ  . Drug use: No  . Sexual activity: Yes    Birth control/protection: None, I.U.D.  Other Topics Concern  . Not on file  Social History Narrative  . Not on file   Social Determinants of Health   Financial Resource Strain: Low Risk   . Difficulty of Paying Living Expenses: Not hard at all  Food Insecurity: No Food Insecurity  . Worried About Programme researcher, broadcasting/film/video in the Last Year: Never true  . Ran Out of Food in the Last Year: Never true  Transportation Needs: No Transportation Needs  . Lack of Transportation (Medical): No  . Lack of  Transportation (Non-Medical): No  Physical Activity: Insufficiently Active  . Days of Exercise per Week: 3 days  . Minutes of Exercise per Session: 30 min  Stress: Unknown  . Feeling of Stress : Patient refused  Social Connections: Unknown  . Frequency of Communication with Friends and Family: Three times a week  . Frequency of Social Gatherings with Friends and Family: Twice a week  . Attends Religious Services: More than 4 times per year  . Active Member of Clubs or Organizations: Patient refused  . Attends Banker Meetings: Patient refused  . Marital Status: Married  Catering manager Violence: Not At Risk  . Fear of Current or Ex-Partner: No  . Emotionally Abused: No  . Physically Abused: No  . Sexually Abused: No    Outpatient Medications Prior to Visit  Medication Sig Dispense Refill  . ibuprofen (ADVIL) 800 MG tablet Take 1 tablet (800 mg total) by mouth 3 (three) times daily. (Patient not taking: Reported on 07/07/2019) 21 tablet 0  . ondansetron (ZOFRAN) 4 MG tablet Take 1 tablet (4 mg total) by mouth every 6 (six) hours. (Patient not taking: Reported on 07/07/2019) 12 tablet 0  . Prenatal Vit-Fe Fumarate-FA (PRENATAL VITAMINS) 28-0.8 MG TABS Take 1 tablet by  mouth daily. (Patient not taking: Reported on 07/07/2019) 60 tablet 3   No facility-administered medications prior to visit.    No Known Allergies  ROS Review of Systems  Constitutional: Negative.   HENT: Negative.   Eyes: Negative.   Respiratory: Negative.   Cardiovascular: Negative.   Gastrointestinal: Negative.   Endocrine: Negative.   Genitourinary: Positive for pelvic pain (occ, pt states it is related to IUD). Negative for decreased urine volume, difficulty urinating, dyspareunia, dysuria, enuresis, flank pain, frequency, genital sores, hematuria, menstrual problem, urgency, vaginal bleeding, vaginal discharge and vaginal pain.  Musculoskeletal: Negative.   Skin: Negative.     Allergic/Immunologic: Negative.   Neurological: Negative.   Hematological: Negative.   Psychiatric/Behavioral: Negative.   All other systems reviewed and are negative.     Objective:    Physical Exam  Constitutional: She is oriented to person, place, and time. She appears well-developed and well-nourished. No distress.  HENT:  Head: Normocephalic and atraumatic.  Right Ear: External ear normal.  Left Ear: External ear normal.  Nose: Nose normal.  Mouth/Throat: Oropharynx is clear and moist. No oropharyngeal exudate.  Eyes: Pupils are equal, round, and reactive to light. Conjunctivae and EOM are normal. Right eye exhibits no discharge. Left eye exhibits no discharge. No scleral icterus.  Neck: No tracheal deviation present. No thyromegaly present.  Cardiovascular: Normal rate, regular rhythm, normal heart sounds and intact distal pulses. Exam reveals no gallop and no friction rub.  No murmur heard. Pulmonary/Chest: Effort normal and breath sounds normal. No respiratory distress. She has no wheezes. She has no rales. She exhibits no tenderness.  Abdominal: Soft. Bowel sounds are normal. She exhibits no distension and no mass. There is no abdominal tenderness. There is no rebound and no guarding.  Genitourinary:    Vagina and uterus normal.  Rectum:     Guaiac result negative.  Cervix exhibits no motion tenderness, no discharge and no friability.    No vaginal discharge.      Musculoskeletal:        General: No tenderness, deformity or edema. Normal range of motion.     Cervical back: Normal range of motion and neck supple.  Lymphadenopathy:    She has no cervical adenopathy.  Neurological: She is alert and oriented to person, place, and time. No cranial nerve deficit. She exhibits normal muscle tone. Coordination normal.  Skin: Skin is warm and dry. No rash noted. She is not diaphoretic. No erythema. No pallor.  Psychiatric: She has a normal mood and affect. Her behavior is  normal. Judgment and thought content normal.  Nursing note and vitals reviewed.   BP 100/69   Pulse 63   Temp 98 F (36.7 C) (Temporal)   Ht 5\' 4"  (1.626 m)   Wt 196 lb (88.9 kg)   SpO2 99%   BMI 33.64 kg/m  Wt Readings from Last 3 Encounters:  07/07/19 196 lb (88.9 kg)  01/04/19 196 lb (88.9 kg)  12/16/18 187 lb (84.8 kg)     Health Maintenance Due  Topic Date Due  . PAP SMEAR-Modifier  07/09/2019    There are no preventive care reminders to display for this patient.  Lab Results  Component Value Date   TSH 0.959 01/04/2019   Lab Results  Component Value Date   WBC 4.6 01/04/2019   HGB 12.1 01/04/2019   HCT 37.3 01/04/2019   MCV 92 01/04/2019   PLT 300 01/04/2019   Lab Results  Component Value Date   NA  141 01/04/2019   K 4.1 01/04/2019   CO2 26 01/04/2019   GLUCOSE 90 01/04/2019   BUN 12 01/04/2019   CREATININE 0.80 01/04/2019   BILITOT 0.2 01/04/2019   ALKPHOS 65 01/04/2019   AST 23 01/04/2019   ALT 15 01/04/2019   PROT 7.5 01/04/2019   ALBUMIN 4.5 01/04/2019   CALCIUM 9.4 01/04/2019   ANIONGAP 8 12/27/2018   Lab Results  Component Value Date   CHOL 129 01/04/2019   Lab Results  Component Value Date   HDL 39 (L) 01/04/2019   Lab Results  Component Value Date   LDLCALC 71 01/04/2019   Lab Results  Component Value Date   TRIG 102 01/04/2019   Lab Results  Component Value Date   CHOLHDL 3.3 01/04/2019   Lab Results  Component Value Date   HGBA1C 6.0 (H) 01/04/2019      Assessment & Plan:   Problem List Items Addressed This Visit    None    Visit Diagnoses    Encounter for IUD removal    -  Primary   Relevant Orders   Ambulatory referral to Gynecology   Screening for endocrine, metabolic and immunity disorder       Relevant Orders   CBC With Differential   Comprehensive metabolic panel   TSH   Hemoglobin A1c   Lipid screening       Relevant Orders   Lipid panel   Annual physical exam       Routine general medical  examination at a health care facility          No orders of the defined types were placed in this encounter.   Follow-up: Return in 1 year (on 07/06/2020).   PLAN  Unfortunately could not visualize IUD strings at today's visit. Will refer to gyn  Otherwise, unremarkable exam today.  Labs drawn, will follow up as warranted  Return in 1 year for CPE  Patient encouraged to call clinic with any questions, comments, or concerns.  Maximiano Coss, NP

## 2019-07-08 LAB — COMPREHENSIVE METABOLIC PANEL
ALT: 23 IU/L (ref 0–32)
AST: 22 IU/L (ref 0–40)
Albumin/Globulin Ratio: 1.8 (ref 1.2–2.2)
Albumin: 4.4 g/dL (ref 3.8–4.8)
Alkaline Phosphatase: 64 IU/L (ref 39–117)
BUN/Creatinine Ratio: 8 — ABNORMAL LOW (ref 9–23)
BUN: 6 mg/dL (ref 6–20)
Bilirubin Total: 0.3 mg/dL (ref 0.0–1.2)
CO2: 23 mmol/L (ref 20–29)
Calcium: 8.9 mg/dL (ref 8.7–10.2)
Chloride: 104 mmol/L (ref 96–106)
Creatinine, Ser: 0.74 mg/dL (ref 0.57–1.00)
GFR calc Af Amer: 121 mL/min/{1.73_m2} (ref >59)
GFR calc non Af Amer: 105 mL/min/{1.73_m2} (ref >59)
Globulin, Total: 2.5 g/dL (ref 1.5–4.5)
Glucose: 103 mg/dL — ABNORMAL HIGH (ref 65–99)
Potassium: 3.9 mmol/L (ref 3.5–5.2)
Sodium: 138 mmol/L (ref 134–144)
Total Protein: 6.9 g/dL (ref 6.0–8.5)

## 2019-07-08 LAB — CBC WITH DIFFERENTIAL
Basophils Absolute: 0 10*3/uL (ref 0.0–0.2)
Basos: 1 %
EOS (ABSOLUTE): 0.2 10*3/uL (ref 0.0–0.4)
Eos: 6 %
Hematocrit: 36.8 % (ref 34.0–46.6)
Hemoglobin: 12.4 g/dL (ref 11.1–15.9)
Immature Grans (Abs): 0 10*3/uL (ref 0.0–0.1)
Immature Granulocytes: 0 %
Lymphocytes Absolute: 1.6 10*3/uL (ref 0.7–3.1)
Lymphs: 42 %
MCH: 30.7 pg (ref 26.6–33.0)
MCHC: 33.7 g/dL (ref 31.5–35.7)
MCV: 91 fL (ref 79–97)
Monocytes Absolute: 0.2 10*3/uL (ref 0.1–0.9)
Monocytes: 6 %
Neutrophils Absolute: 1.7 10*3/uL (ref 1.4–7.0)
Neutrophils: 45 %
RBC: 4.04 x10E6/uL (ref 3.77–5.28)
RDW: 11.9 % (ref 11.7–15.4)
WBC: 3.8 10*3/uL (ref 3.4–10.8)

## 2019-07-08 LAB — LIPID PANEL
Chol/HDL Ratio: 3.2 ratio (ref 0.0–4.4)
Cholesterol, Total: 123 mg/dL (ref 100–199)
HDL: 38 mg/dL — ABNORMAL LOW (ref >39)
LDL Chol Calc (NIH): 72 mg/dL (ref 0–99)
Triglycerides: 57 mg/dL (ref 0–149)
VLDL Cholesterol Cal: 13 mg/dL (ref 5–40)

## 2019-07-08 LAB — HEMOGLOBIN A1C
Est. average glucose Bld gHb Est-mCnc: 123 mg/dL
Hgb A1c MFr Bld: 5.9 % — ABNORMAL HIGH (ref 4.8–5.6)

## 2019-07-08 LAB — TSH: TSH: 0.815 u[IU]/mL (ref 0.450–4.500)

## 2019-07-10 ENCOUNTER — Encounter: Payer: Self-pay | Admitting: Registered Nurse

## 2019-08-22 ENCOUNTER — Other Ambulatory Visit: Payer: Self-pay

## 2019-08-22 ENCOUNTER — Encounter: Payer: Self-pay | Admitting: *Deleted

## 2019-08-22 ENCOUNTER — Ambulatory Visit (INDEPENDENT_AMBULATORY_CARE_PROVIDER_SITE_OTHER): Payer: BC Managed Care – PPO | Admitting: Family Medicine

## 2019-08-22 ENCOUNTER — Encounter: Payer: Self-pay | Admitting: Family Medicine

## 2019-08-22 VITALS — BP 136/68 | HR 70 | Wt 193.2 lb

## 2019-08-22 DIAGNOSIS — T8332XA Displacement of intrauterine contraceptive device, initial encounter: Secondary | ICD-10-CM | POA: Diagnosis not present

## 2019-08-22 NOTE — Progress Notes (Signed)
   IUD Removal Procedure Note   Patient is 34 y.o. [id number] who is here for Liletta IUD removal. She would like IUD removed secondary to desire for pregnancy. She has had no issues with IUD- her PCP was unable to visualize strings for removal. She understands that she could get pregnant after removal of IUD if she does not use another form of contraception. She has no other complaints today. Reviewed risks of removal including pain, bleeding, difficult removal and inability to remove IUD which may require hysteroscopic removal in OR. She affirms that she would like IUD removed.  BP 136/68   Pulse 70   Wt 193 lb 3.2 oz (87.6 kg)   BMI 33.16 kg/m   Patient with normal appearing external female genitalia. Graves speculum placed in vagina and IUD strings unable to be visualized. Tresa Endo and IUD hook attempted for removal. IUD strings unable to be visualized after multiple attempts, IUD unable to be removed. Minimal bleeding noted. All instruments removed from vagina. Patient tolerated procedure very well.   1. Intrauterine contraceptive device threads lost, initial encounter - IUD unable to be removed - US PELVIC COMPLETE WITH TRANSVAGINAL; Future  Follow up in 3 weeks with MD-GYN attending  Marlowe Alt, DO OB Fellow, [facility] 08/22/2019 1:43 PM

## 2019-08-22 NOTE — Progress Notes (Signed)
Here for iud removal

## 2019-08-22 NOTE — Patient Instructions (Signed)
Hormonal Contraception Information Hormonal contraception is a type of birth control that uses hormones to prevent pregnancy. It usually involves a combination of the hormones estrogen and progesterone or only the hormone progesterone. Hormonal contraception works in these ways:  It thickens the mucus in the cervix, making it harder for sperm to enter the uterus.  It changes the lining of the uterus, making it harder for an egg to implant.  It may stop the ovaries from releasing eggs (ovulation). Some women who take hormonal contraceptives that contain only progesterone may continue to ovulate. Hormonal contraception cannot prevent sexually transmitted infections (STIs). Pregnancy may still occur. Estrogen and progesterone contraceptives Contraceptives that use a combination of estrogen and progesterone are available in these forms:  Pill. Pills come in different combinations of hormones. They must be taken at the same time each day. Pills can affect your period, causing you to get your period once every three months or not at all.  Patch. The patch must be worn on the lower abdomen for three weeks and then removed on the fourth.  Vaginal ring. The ring is placed in the vagina and left there for three weeks. It is then removed for one week. Progesterone contraceptives Contraceptives that use progesterone only are available in these forms:  Pill. Pills should be taken every day of the cycle.  Intrauterine device (IUD). This device is inserted into the uterus and removed or replaced every five years or sooner.  Implant. Plastic rods are placed under the skin of the upper arm. They are removed or replaced every three years or sooner.  Injection. The injection is given once every 90 days. What are the side effects? The side effects of estrogen and progesterone contraceptives include:  Nausea.  Headaches.  Breast tenderness.  Bleeding or spotting between menstrual cycles.  High blood  pressure (rare).  Strokes, heart attacks, or blood clots (rare) Side effects of progesterone-only contraceptives include:  Nausea.  Headaches.  Breast tenderness.  Unpredictable menstrual bleeding.  High blood pressure (rare). Talk to your health care provider about what side effects may affect you. Where to find more information  Ask your health care provider for more information and resources about hormonal contraception.  [place] Department of Health and Human Services Office on Women's Health: www.womenshealth.gov Questions to ask:  What type of hormonal contraception is right for me?  How long should I plan to use hormonal contraception?  What are the side effects of the hormonal contraception method I choose?  How can I prevent STIs while using hormonal contraception? Contact a health care provider if:  You start taking hormonal contraceptives and you develop persistent or severe side effects. Summary  Estrogen and progesterone are hormones used in many forms of birth control.  Talk to your health care provider about what side effects may affect you.  Hormonal contraception cannot prevent sexually transmitted infections (STIs).  Ask your health care provider for more information and resources about hormonal contraception. This information is not intended to replace advice given to you by your health care provider. Make sure you discuss any questions you have with your health care provider. Document Revised: 08/08/2018 Document Reviewed: 03/13/2016 Elsevier Patient Education  2020 Elsevier Inc.  

## 2019-08-22 NOTE — Addendum Note (Signed)
Addended by: Milinda Antis on: 08/22/2019 01:46 PM   Modules accepted: Level of Service

## 2019-08-31 ENCOUNTER — Ambulatory Visit (HOSPITAL_COMMUNITY)
Admission: RE | Admit: 2019-08-31 | Discharge: 2019-08-31 | Disposition: A | Discharge status: Home / Self Care | Payer: BC Managed Care – PPO | Source: Ambulatory Visit | Attending: Family Medicine | Admitting: Family Medicine

## 2019-08-31 ENCOUNTER — Other Ambulatory Visit: Payer: Self-pay

## 2019-08-31 DIAGNOSIS — T8332XA Displacement of intrauterine contraceptive device, initial encounter: Secondary | ICD-10-CM | POA: Insufficient documentation

## 2019-09-15 ENCOUNTER — Other Ambulatory Visit: Payer: Self-pay

## 2019-09-15 ENCOUNTER — Ambulatory Visit (INDEPENDENT_AMBULATORY_CARE_PROVIDER_SITE_OTHER): Payer: BC Managed Care – PPO | Admitting: Obstetrics & Gynecology

## 2019-09-15 VITALS — BP 112/91 | HR 74 | Wt 196.0 lb

## 2019-09-15 DIAGNOSIS — Z30432 Encounter for removal of intrauterine contraceptive device: Secondary | ICD-10-CM | POA: Diagnosis not present

## 2019-09-15 NOTE — Progress Notes (Signed)
    GYNECOLOGY OFFICE PROCEDURE NOTE  Nichole Perkins is a 34 y.o. [phone] here for Liletta IUD removal. No GYN concerns.  Last pap smear was on 06/2016 and was normal.  IUD Removal  Patient identified, informed consent performed, consent signed.  Patient was in the dorsal lithotomy position, normal external genitalia was noted.  A speculum was placed in the patient's vagina, normal discharge was noted, no lesions. The cervix was visualized, no lesions, no abnormal discharge. The strings of the IUD were not visualized, so Kelly forceps were introduced into the endometrial cavity, followed by the IUD hook which allowed the IUD to be removed in its entirety.  Patient tolerated the procedure well.    Patient will use barrier method for contraception/.  Routine preventative health maintenance measures emphasized.   Jaynie Collins, MD, FACOG Obstetrician & Gynecologist, [facility] [employer], [facility]

## 2019-09-15 NOTE — Patient Instructions (Signed)
Contraception Choices Contraception, also called birth control, refers to methods or devices that prevent pregnancy. Hormonal methods Contraceptive implant  A contraceptive implant is a thin, plastic tube that contains a hormone. It is inserted into the upper part of the arm. It can remain in place for up to 3 years. Progestin-only injections Progestin-only injections are injections of progestin, a synthetic form of the hormone progesterone. They are given every 3 months by a health care provider. Birth control pills  Birth control pills are pills that contain hormones that prevent pregnancy. They must be taken once a day, preferably at the same time each day. Birth control patch  The birth control patch contains hormones that prevent pregnancy. It is placed on the skin and must be changed once a week for three weeks and removed on the fourth week. A prescription is needed to use this method of contraception. Vaginal ring  A vaginal ring contains hormones that prevent pregnancy. It is placed in the vagina for three weeks and removed on the fourth week. After that, the process is repeated with a new ring. A prescription is needed to use this method of contraception. Emergency contraceptive Emergency contraceptives prevent pregnancy after unprotected sex. They come in pill form and can be taken up to 5 days after sex. They work best the sooner they are taken after having sex. Most emergency contraceptives are available without a prescription. This method should not be used as your only form of birth control. Barrier methods Female condom  A female condom is a thin sheath that is worn over the penis during sex. Condoms keep sperm from going inside a woman's body. They can be used with a spermicide to increase their effectiveness. They should be disposed after a single use. Female condom  A female condom is a soft, loose-fitting sheath that is put into the vagina before sex. The condom keeps sperm  from going inside a woman's body. They should be disposed after a single use. Diaphragm  A diaphragm is a soft, dome-shaped barrier. It is inserted into the vagina before sex, along with a spermicide. The diaphragm blocks sperm from entering the uterus, and the spermicide kills sperm. A diaphragm should be left in the vagina for 6-8 hours after sex and removed within 24 hours. A diaphragm is prescribed and fitted by a health care provider. A diaphragm should be replaced every 1-2 years, after giving birth, after gaining more than 15 lb (6.8 kg), and after pelvic surgery. Cervical cap  A cervical cap is a round, soft latex or plastic cup that fits over the cervix. It is inserted into the vagina before sex, along with spermicide. It blocks sperm from entering the uterus. The cap should be left in place for 6-8 hours after sex and removed within 48 hours. A cervical cap must be prescribed and fitted by a health care provider. It should be replaced every 2 years. Sponge  A sponge is a soft, circular piece of polyurethane foam with spermicide on it. The sponge helps block sperm from entering the uterus, and the spermicide kills sperm. To use it, you make it wet and then insert it into the vagina. It should be inserted before sex, left in for at least 6 hours after sex, and removed and thrown away within 30 hours. Spermicides Spermicides are chemicals that kill or block sperm from entering the cervix and uterus. They can come as a cream, jelly, suppository, foam, or tablet. A spermicide should be inserted into the   vagina with an applicator at least 10-15 minutes before sex to allow time for it to work. The process must be repeated every time you have sex. Spermicides do not require a prescription. Intrauterine contraception Intrauterine device (IUD) An IUD is a T-shaped device that is put in a woman's uterus. There are two types:  Hormone IUD.This type contains progestin, a synthetic form of the hormone  progesterone. This type can stay in place for 3-5 years.  Copper IUD.This type is wrapped in copper wire. It can stay in place for 10 years.  Permanent methods of contraception Female tubal ligation In this method, a woman's fallopian tubes are sealed, tied, or blocked during surgery to prevent eggs from traveling to the uterus. Hysteroscopic sterilization In this method, a small, flexible insert is placed into each fallopian tube. The inserts cause scar tissue to form in the fallopian tubes and block them, so sperm cannot reach an egg. The procedure takes about 3 months to be effective. Another form of birth control must be used during those 3 months. Female sterilization This is a procedure to tie off the tubes that carry sperm (vasectomy). After the procedure, the man can still ejaculate fluid (semen). Natural planning methods Natural family planning In this method, a couple does not have sex on days when the woman could become pregnant. Calendar method This means keeping track of the length of each menstrual cycle, identifying the days when pregnancy can happen, and not having sex on those days. Ovulation method In this method, a couple avoids sex during ovulation. Symptothermal method This method involves not having sex during ovulation. The woman typically checks for ovulation by watching changes in her temperature and in the consistency of cervical mucus. Post-ovulation method In this method, a couple waits to have sex until after ovulation. Summary  Contraception, also called birth control, means methods or devices that prevent pregnancy.  Hormonal methods of contraception include implants, injections, pills, patches, vaginal rings, and emergency contraceptives.  Barrier methods of contraception can include female condoms, female condoms, diaphragms, cervical caps, sponges, and spermicides.  There are two types of IUDs (intrauterine devices). An IUD can be put in a woman's uterus to  prevent pregnancy for 3-5 years.  Permanent sterilization can be done through a procedure for males, females, or both.  Natural family planning methods involve not having sex on days when the woman could become pregnant. This information is not intended to replace advice given to you by your health care provider. Make sure you discuss any questions you have with your health care provider. Document Revised: 04/15/2017 Document Reviewed: 05/16/2016 Elsevier Patient Education  2020 Elsevier Inc.  

## 2019-10-23 ENCOUNTER — Encounter: Payer: Self-pay | Admitting: *Deleted

## 2019-12-26 ENCOUNTER — Encounter: Payer: Self-pay | Admitting: Registered Nurse

## 2019-12-26 ENCOUNTER — Telehealth (INDEPENDENT_AMBULATORY_CARE_PROVIDER_SITE_OTHER): Payer: BC Managed Care – PPO | Admitting: Registered Nurse

## 2019-12-26 ENCOUNTER — Other Ambulatory Visit: Payer: Self-pay

## 2019-12-26 VITALS — Ht 65.0 in | Wt 198.0 lb

## 2019-12-26 DIAGNOSIS — R05 Cough: Secondary | ICD-10-CM

## 2019-12-26 DIAGNOSIS — R059 Cough, unspecified: Secondary | ICD-10-CM

## 2019-12-26 MED ORDER — BENZONATATE 200 MG PO CAPS: 200.0000 mg | ORAL_CAPSULE | Freq: Two times a day (BID) | ORAL | 0 refills | Status: DC | PRN

## 2019-12-26 NOTE — Progress Notes (Signed)
Telemedicine Encounter- SOAP NOTE Established Patient  This telephone encounter was conducted with the patient's (or proxy's) verbal consent via audio telecommunications: yes Patient was instructed to have this encounter in a suitably private space; and to only have persons present to whom they give permission to participate. In addition, patient identity was confirmed by use of name plus two identifiers (DOB and address).  I discussed the limitations, risks, security and privacy concerns of performing an evaluation and management service by telephone and the availability of in person appointments. I also discussed with the patient that there may be a patient responsible charge related to this service. The patient expressed understanding and agreed to proceed.  I spent a total of 13 mintues talking with the patient or their proxy.  Patient at home Provider in office.  Chief Complaint  Patient presents with  . Cough    allergic reaction to new place.   . Fatigue    Subjective   Nichole Perkins is a 34 y.o. established patient. Telephone visit today for cough and fatigue  HPI Has been ongoing for some time, but worsened over the past week. Arrived to work on Sunday and did not pass COVID questionnaire. She has been sent home to quarantine for ten days. No further symptoms of COVID: denies shob, doe, headache, sensory changes, fever, chills, nvd.  No sick contacts  rec'd vaccine in march and April  Taking cetirizine helps symptoms No other tx tried  Patient Active Problem List   Diagnosis Date Noted  . Status post repeat low transverse cesarean section 12/01/2016  . Breech presentation 11/30/2016  . Supervision of low-risk pregnancy 07/08/2016  . Late prenatal care affecting pregnancy, antepartum 07/08/2016  . Previous cesarean section complicating pregnancy 07/08/2016    No past medical history on file.  Current Outpatient Medications  Medication Sig Dispense Refill  .  Multiple Vitamin (MULTI-DAY PO) Take by mouth.    . benzonatate (TESSALON) 200 MG capsule Take 1 capsule (200 mg total) by mouth 2 (two) times daily as needed for cough. 20 capsule 0   No current facility-administered medications for this visit.    No Known Allergies  Social History   Socioeconomic History  . Marital status: Married    Spouse name: Not on file  . Number of children: 3  . Years of education: Not on file  . Highest education level: Not on file  Occupational History  . Not on file  Tobacco Use  . Smoking status: Never Smoker  . Smokeless tobacco: Never Used  Vaping Use  . Vaping Use: Never used  Substance and Sexual Activity  . Alcohol use: Yes    Comment: occ  . Drug use: No  . Sexual activity: Yes    Birth control/protection: None, I.U.D.  Other Topics Concern  . Not on file  Social History Narrative  . Not on file   Social Determinants of Health   Financial Resource Strain: Low Risk   . Difficulty of Paying Living Expenses: Not hard at all  Food Insecurity: No Food Insecurity  . Worried About Programme researcher, broadcasting/film/video in the Last Year: Never true  . Ran Out of Food in the Last Year: Never true  Transportation Needs: No Transportation Needs  . Lack of Transportation (Medical): No  . Lack of Transportation (Non-Medical): No  Physical Activity: Insufficiently Active  . Days of Exercise per Week: 3 days  . Minutes of Exercise per Session: 30 min  Stress: Unknown  .  Feeling of Stress : Patient refused  Social Connections: Unknown  . Frequency of Communication with Friends and Family: Three times a week  . Frequency of Social Gatherings with Friends and Family: Twice a week  . Attends Religious Services: More than 4 times per year  . Active Member of Clubs or Organizations: Patient refused  . Attends Banker Meetings: Patient refused  . Marital Status: Married  Catering manager Violence: Not At Risk  . Fear of Current or Ex-Partner: No  .  Emotionally Abused: No  . Physically Abused: No  . Sexually Abused: No    ROS Pertinent positives per hpi   Objective   Vitals as reported by the patient: Today's Vitals   12/26/19 1705  Weight: 198 lb (89.8 kg)  Height: 5\' 5"  (1.651 m)    [name] was seen today for cough and fatigue.  Diagnoses and all orders for this visit:  Cough -     benzonatate (TESSALON) 200 MG capsule; Take 1 capsule (200 mg total) by mouth 2 (two) times daily as needed for cough.   PLAN  Tessalon for cough  Suggest COVID-19 testing for breakthrough case  Stay at home, rest, hydrate, follow up PRN  Hospital precautions reviewed  Patient encouraged to call clinic with any questions, comments, or concerns.  I discussed the assessment and treatment plan with the patient. The patient was provided an opportunity to ask questions and all were answered. The patient agreed with the plan and demonstrated an understanding of the instructions.   The patient was advised to call back or seek an in-person evaluation if the symptoms worsen or if the condition fails to improve as anticipated.  I provided 13 minutes of non-face-to-face time during this encounter.  [name], NP  Primary Care at [facility]

## 2020-03-07 ENCOUNTER — Other Ambulatory Visit: Payer: Self-pay

## 2020-03-07 ENCOUNTER — Ambulatory Visit (INDEPENDENT_AMBULATORY_CARE_PROVIDER_SITE_OTHER): Payer: 59 | Admitting: Family Medicine

## 2020-03-07 DIAGNOSIS — Z23 Encounter for immunization: Secondary | ICD-10-CM

## 2020-03-07 NOTE — Patient Instructions (Signed)
° ° ° °  If you have lab work done today you will be contacted with your lab results within the next 2 weeks.  If you have not heard from us then please contact us. The fastest way to get your results is to register for My Chart. ° ° °IF you received an x-ray today, you will receive an invoice from [facility]. Please contact [facility] at [phone] with questions or concerns regarding your invoice.  ° °IF you received labwork today, you will receive an invoice from [facility]. Please contact [facility] at [phone] with questions or concerns regarding your invoice.  ° °Our billing staff will not be able to assist you with questions regarding bills from these companies. ° °You will be contacted with the lab results as soon as they are available. The fastest way to get your results is to activate your My Chart account. Instructions are located on the last page of this paperwork. If you have not heard from us regarding the results in 2 weeks, please contact this office. °  ° ° ° °

## 2020-05-22 ENCOUNTER — Ambulatory Visit: Payer: 59 | Admitting: Registered Nurse

## 2020-10-02 ENCOUNTER — Ambulatory Visit (INDEPENDENT_AMBULATORY_CARE_PROVIDER_SITE_OTHER): Payer: 59 | Admitting: Registered Nurse

## 2020-10-02 ENCOUNTER — Other Ambulatory Visit: Payer: Self-pay

## 2020-10-02 ENCOUNTER — Other Ambulatory Visit: Payer: Self-pay | Admitting: Registered Nurse

## 2020-10-02 ENCOUNTER — Encounter: Payer: Self-pay | Admitting: Registered Nurse

## 2020-10-02 VITALS — BP 110/66 | HR 72 | Temp 98.3°F | Ht 65.0 in | Wt 216.0 lb

## 2020-10-02 DIAGNOSIS — Z1329 Encounter for screening for other suspected endocrine disorder: Secondary | ICD-10-CM | POA: Diagnosis not present

## 2020-10-02 DIAGNOSIS — Z1159 Encounter for screening for other viral diseases: Secondary | ICD-10-CM | POA: Diagnosis not present

## 2020-10-02 DIAGNOSIS — Z13220 Encounter for screening for lipoid disorders: Secondary | ICD-10-CM | POA: Diagnosis not present

## 2020-10-02 DIAGNOSIS — Z130 Encounter for screening for diseases of the blood and blood-forming organs and certain disorders involving the immune mechanism: Secondary | ICD-10-CM

## 2020-10-02 DIAGNOSIS — Z0000 Encounter for general adult medical examination without abnormal findings: Secondary | ICD-10-CM

## 2020-10-02 DIAGNOSIS — Z13228 Encounter for screening for other metabolic disorders: Secondary | ICD-10-CM | POA: Diagnosis not present

## 2020-10-02 LAB — TSH: TSH: 1.09 u[IU]/mL (ref 0.35–4.50)

## 2020-10-02 LAB — LIPID PANEL
Cholesterol: 135 mg/dL (ref 0–200)
HDL: 40.5 mg/dL (ref >39.00)
LDL Cholesterol: 80 mg/dL (ref 0–99)
NonHDL: 94.6
Total CHOL/HDL Ratio: 3
Triglycerides: 71 mg/dL (ref 0.0–149.0)
VLDL: 14.2 mg/dL (ref 0.0–40.0)

## 2020-10-02 LAB — CBC WITH DIFFERENTIAL/PLATELET
Basophils Absolute: 0 10*3/uL (ref 0.0–0.1)
Basophils Relative: 0.6 % (ref 0.0–3.0)
Eosinophils Absolute: 0.1 10*3/uL (ref 0.0–0.7)
Eosinophils Relative: 2.5 % (ref 0.0–5.0)
HCT: 38.5 % (ref 36.0–46.0)
Hemoglobin: 12.8 g/dL (ref 12.0–15.0)
Lymphocytes Relative: 32.6 % (ref 12.0–46.0)
Lymphs Abs: 1.5 10*3/uL (ref 0.7–4.0)
MCHC: 33.2 g/dL (ref 30.0–36.0)
MCV: 91.9 fl (ref 78.0–100.0)
Monocytes Absolute: 0.3 10*3/uL (ref 0.1–1.0)
Monocytes Relative: 6.2 % (ref 3.0–12.0)
Neutro Abs: 2.7 10*3/uL (ref 1.4–7.7)
Neutrophils Relative %: 58.1 % (ref 43.0–77.0)
Platelets: 237 10*3/uL (ref 150.0–400.0)
RBC: 4.19 Mil/uL (ref 3.87–5.11)
RDW: 13.2 % (ref 11.5–15.5)
WBC: 4.6 10*3/uL (ref 4.0–10.5)

## 2020-10-02 LAB — COMPREHENSIVE METABOLIC PANEL
ALT: 52 U/L — ABNORMAL HIGH (ref 0–35)
AST: 35 U/L (ref 0–37)
Albumin: 4.2 g/dL (ref 3.5–5.2)
Alkaline Phosphatase: 68 U/L (ref 39–117)
BUN: 11 mg/dL (ref 6–23)
CO2: 24 mEq/L (ref 19–32)
Calcium: 8.9 mg/dL (ref 8.4–10.5)
Chloride: 104 mEq/L (ref 96–112)
Creatinine, Ser: 0.7 mg/dL (ref 0.40–1.20)
GFR: 111.3 mL/min (ref >60.00)
Glucose, Bld: 132 mg/dL — ABNORMAL HIGH (ref 70–99)
Potassium: 4.4 mEq/L (ref 3.5–5.1)
Sodium: 137 mEq/L (ref 135–145)
Total Bilirubin: 0.3 mg/dL (ref 0.2–1.2)
Total Protein: 7 g/dL (ref 6.0–8.3)

## 2020-10-02 LAB — HEMOGLOBIN A1C: Hgb A1c MFr Bld: 6.4 % (ref 4.6–6.5)

## 2020-10-02 NOTE — Progress Notes (Signed)
Established Patient Office Visit  Subjective:  Patient ID: Nichole Perkins, female    DOB: Sep 14, 1985  Age: 35 y.o. MRN: [id number]  CC:  Chief Complaint  Patient presents with  . Annual Exam    HPI Nichole Perkins presents for CPE  No acute concerns  Histories reviewed and updated with patient.   Pap w/ HPV wnl in 2018 - due 2023   No past medical history on file.  Past Surgical History:  Procedure Laterality Date  . CESAREAN SECTION    . CESAREAN SECTION N/A 12/01/2016   Procedure: CESAREAN SECTION;  Surgeon: Levie Heritage, DO;  Location: [facility];  Service: Obstetrics;  Laterality: N/A;  . CESAREAN SECTION    . WRIST SURGERY Left 2010   growth removed    Family History  Problem Relation Age of Onset  . Hypertension Mother   . Diabetes Father   . Hypertension Father   . Hypertension Brother     Social History   Socioeconomic History  . Marital status: Married    Spouse name: Not on file  . Number of children: 3  . Years of education: Not on file  . Highest education level: Not on file  Occupational History  . Not on file  Tobacco Use  . Smoking status: Never Smoker  . Smokeless tobacco: Never Used  Vaping Use  . Vaping Use: Never used  Substance and Sexual Activity  . Alcohol use: Yes    Comment: occ  . Drug use: No  . Sexual activity: Yes    Birth control/protection: None, I.U.D.  Other Topics Concern  . Not on file  Social History Narrative  . Not on file   Social Determinants of Health   Financial Resource Strain: Not on file  Food Insecurity: Not on file  Transportation Needs: Not on file  Physical Activity: Not on file  Stress: Not on file  Social Connections: Not on file  Intimate Partner Violence: Not on file    Outpatient Medications Prior to Visit  Medication Sig Dispense Refill  . Multiple Vitamin (MULTI-DAY PO) Take by mouth.    . benzonatate (TESSALON) 200 MG capsule Take 1 capsule (200 mg total) by mouth 2 (two)  times daily as needed for cough. 20 capsule 0   No facility-administered medications prior to visit.    No Known Allergies  ROS Review of Systems  Constitutional: Negative.   HENT: Negative.   Eyes: Negative.   Respiratory: Negative.   Cardiovascular: Negative.   Gastrointestinal: Negative.   Genitourinary: Negative.   Musculoskeletal: Negative.   Skin: Negative.   Neurological: Negative.   Psychiatric/Behavioral: Negative.   All other systems reviewed and are negative.     Objective:    Physical Exam Vitals and nursing note reviewed.  Constitutional:      General: She is not in acute distress.    Appearance: Normal appearance. She is normal weight. She is not ill-appearing, toxic-appearing or diaphoretic.  HENT:     Head: Normocephalic and atraumatic.     Right Ear: Tympanic membrane, ear canal and external ear normal. There is no impacted cerumen.     Left Ear: Tympanic membrane, ear canal and external ear normal. There is no impacted cerumen.     Nose: Nose normal. No congestion or rhinorrhea.     Mouth/Throat:     Mouth: Mucous membranes are moist.     Pharynx: Oropharynx is clear. No oropharyngeal exudate or posterior oropharyngeal erythema.  Eyes:  General: No scleral icterus.       Right eye: No discharge.        Left eye: No discharge.     Extraocular Movements: Extraocular movements intact.     Conjunctiva/sclera: Conjunctivae normal.     Pupils: Pupils are equal, round, and reactive to light.  Cardiovascular:     Rate and Rhythm: Normal rate and regular rhythm.     Pulses: Normal pulses.     Heart sounds: Normal heart sounds. No murmur heard. No friction rub. No gallop.   Pulmonary:     Effort: Pulmonary effort is normal. No respiratory distress.     Breath sounds: Normal breath sounds. No stridor. No wheezing, rhonchi or rales.  Chest:     Chest wall: No tenderness.  Abdominal:     General: Abdomen is flat. Bowel sounds are normal. There is no  distension.     Palpations: Abdomen is soft. There is no mass.     Tenderness: There is no abdominal tenderness. There is no right CVA tenderness, left CVA tenderness, guarding or rebound.     Hernia: No hernia is present.  Musculoskeletal:        General: Tenderness (posterior R shoulder) present. No swelling, deformity or signs of injury. Normal range of motion.     Right lower leg: No edema.     Left lower leg: No edema.  Skin:    General: Skin is warm and dry.     Capillary Refill: Capillary refill takes less than 2 seconds.     Coloration: Skin is not jaundiced or pale.     Findings: No bruising, erythema, lesion or rash.  Neurological:     General: No focal deficit present.     Mental Status: She is alert and oriented to person, place, and time. Mental status is at baseline.     Cranial Nerves: No cranial nerve deficit.     Sensory: No sensory deficit.     Motor: No weakness.     Coordination: Coordination normal.     Gait: Gait normal.     Deep Tendon Reflexes: Reflexes normal.  Psychiatric:        Mood and Affect: Mood normal.        Behavior: Behavior normal.        Thought Content: Thought content normal.        Judgment: Judgment normal.     BP 110/66   Pulse 72   Temp 98.3 F (36.8 C)   Ht 5\' 5"  (1.651 m)   Wt 216 lb (98 kg)   SpO2 100%   BMI 35.94 kg/m  Wt Readings from Last 3 Encounters:  10/02/20 216 lb (98 kg)  12/26/19 198 lb (89.8 kg)  09/15/19 196 lb (88.9 kg)     Health Maintenance Due  Topic Date Due  . COVID-19 Vaccine (1) Never done  . Hepatitis C Screening  Never done  . PAP SMEAR-Modifier  07/09/2019    There are no preventive care reminders to display for this patient.  Lab Results  Component Value Date   TSH 0.815 07/07/2019   Lab Results  Component Value Date   WBC 3.8 07/07/2019   HGB 12.4 07/07/2019   HCT 36.8 07/07/2019   MCV 91 07/07/2019   PLT 300 01/04/2019   Lab Results  Component Value Date   NA 138 07/07/2019    K 3.9 07/07/2019   CO2 23 07/07/2019   GLUCOSE 103 (H) 07/07/2019   BUN  6 07/07/2019   CREATININE 0.74 07/07/2019   BILITOT 0.3 07/07/2019   ALKPHOS 64 07/07/2019   AST 22 07/07/2019   ALT 23 07/07/2019   PROT 6.9 07/07/2019   ALBUMIN 4.4 07/07/2019   CALCIUM 8.9 07/07/2019   ANIONGAP 8 12/27/2018   Lab Results  Component Value Date   CHOL 123 07/07/2019   Lab Results  Component Value Date   HDL 38 (L) 07/07/2019   Lab Results  Component Value Date   LDLCALC 72 07/07/2019   Lab Results  Component Value Date   TRIG 57 07/07/2019   Lab Results  Component Value Date   CHOLHDL 3.2 07/07/2019   Lab Results  Component Value Date   HGBA1C 5.9 (H) 07/07/2019      Assessment & Plan:   Problem List Items Addressed This Visit   None   Visit Diagnoses    Annual physical exam    -  Primary   Screening for endocrine, metabolic and immunity disorder       Relevant Orders   CBC with Differential/Platelet   Comprehensive metabolic panel   Hemoglobin A1c   TSH   Lipid screening       Relevant Orders   Lipid panel      No orders of the defined types were placed in this encounter.   Follow-up: No follow-ups on file.   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 Will give a low dose of flexeril to take before bed. Encourage stretching and proper body mechanics.  Otherwise exam unremarkable  Labs collected. Will follow up with the patient as warranted.  Follow up in 1 year for CPE and labs  Patient encouraged to call clinic with any questions, comments, or concerns.  Janeece Agee, NP

## 2020-10-02 NOTE — Patient Instructions (Signed)
Preventive Care 21-36 Years Old, Female Preventive care refers to lifestyle choices and visits with your health care provider that can promote health and wellness. This includes:  A yearly physical exam. This is also called an annual wellness visit.  Regular dental and eye exams.  Immunizations.  Screening for certain conditions.  Healthy lifestyle choices, such as: ? Eating a healthy diet. ? Getting regular exercise. ? Not using drugs or products that contain nicotine and tobacco. ? Limiting alcohol use. What can I expect for my preventive care visit? Physical exam Your health care provider may check your:  Height and weight. These may be used to calculate your BMI (body mass index). BMI is a measurement that tells if you are at a healthy weight.  Heart rate and blood pressure.  Body temperature.  Skin for abnormal spots. Counseling Your health care provider may ask you questions about your:  Past medical problems.  Family's medical history.  Alcohol, tobacco, and drug use.  Emotional well-being.  Home life and relationship well-being.  Sexual activity.  Diet, exercise, and sleep habits.  Work and work environment.  Access to firearms.  Method of birth control.  Menstrual cycle.  Pregnancy history. What immunizations do I need? Vaccines are usually given at various ages, according to a schedule. Your health care provider will recommend vaccines for you based on your age, medical history, and lifestyle or other factors, such as travel or where you work.   What tests do I need? Blood tests  Lipid and cholesterol levels. These may be checked every 5 years starting at age 20.  Hepatitis C test.  Hepatitis B test. Screening  Diabetes screening. This is done by checking your blood sugar (glucose) after you have not eaten for a while (fasting).  STD (sexually transmitted disease) testing, if you are at risk.  BRCA-related cancer screening. This may be  done if you have a family history of breast, ovarian, tubal, or peritoneal cancers.  Pelvic exam and Pap test. This may be done every 3 years starting at age 21. Starting at age 30, this may be done every 5 years if you have a Pap test in combination with an HPV test. Talk with your health care provider about your test results, treatment options, and if necessary, the need for more tests.   Follow these instructions at home: Eating and drinking  Eat a healthy diet that includes fresh fruits and vegetables, whole grains, lean protein, and low-fat dairy products.  Take vitamin and mineral supplements as recommended by your health care provider.  Do not drink alcohol if: ? Your health care provider tells you not to drink. ? You are pregnant, may be pregnant, or are planning to become pregnant.  If you drink alcohol: ? Limit how much you have to 0-1 drink a day. ? Be aware of how much alcohol is in your drink. In the [place] one drink equals one 12 oz bottle of beer (355 mL), one 5 oz glass of wine (148 mL), or one 1 oz glass of hard liquor (44 mL).   Lifestyle  Take daily care of your teeth and gums. Brush your teeth every morning and night with fluoride toothpaste. Floss one time each day.  Stay active. Exercise for at least 30 minutes 5 or more days each week.  Do not use any products that contain nicotine or tobacco, such as cigarettes, e-cigarettes, and chewing tobacco. If you need help quitting, ask your health care provider.  Do not   use drugs.  If you are sexually active, practice safe sex. Use a condom or other form of protection to prevent STIs (sexually transmitted infections).  If you do not wish to become pregnant, use a form of birth control. If you plan to become pregnant, see your health care provider for a prepregnancy visit.  Find healthy ways to cope with stress, such as: ? Meditation, yoga, or listening to music. ? Journaling. ? Talking to a trusted  person. ? Spending time with friends and family. Safety  Always wear your seat belt while driving or riding in a vehicle.  Do not drive: ? If you have been drinking alcohol. Do not ride with someone who has been drinking. ? When you are tired or distracted. ? While texting.  Wear a helmet and other protective equipment during sports activities.  If you have firearms in your house, make sure you follow all gun safety procedures.  Seek help if you have been physically or sexually abused. What's next?  Go to your health care provider once a year for an annual wellness visit.  Ask your health care provider how often you should have your eyes and teeth checked.  Stay up to date on all vaccines. This information is not intended to replace advice given to you by your health care provider. Make sure you discuss any questions you have with your health care provider. Document Revised: 12/10/2019 Document Reviewed: 12/23/2017 Elsevier Patient Education  2021 Elsevier Inc.  Preventive Care 8-18 Years Old, Female Preventive care refers to lifestyle choices and visits with your health care provider that can promote health and wellness. This includes:  A yearly physical exam. This is also called an annual wellness visit.  Regular dental and eye exams.  Immunizations.  Screening for certain conditions.  Healthy lifestyle choices, such as: ? Eating a healthy diet. ? Getting regular exercise. ? Not using drugs or products that contain nicotine and tobacco. ? Limiting alcohol use. What can I expect for my preventive care visit? Physical exam Your health care provider will check your:  Height and weight. These may be used to calculate your BMI (body mass index). BMI is a measurement that tells if you are at a healthy weight.  Heart rate and blood pressure.  Body temperature.  Skin for abnormal spots. Counseling Your health care provider may ask you questions about your:  Past  medical problems.  Family's medical history.  Alcohol, tobacco, and drug use.  Emotional well-being.  Home life and relationship well-being.  Sexual activity.  Diet, exercise, and sleep habits.  Work and work Statistician.  Access to firearms.  Method of birth control.  Menstrual cycle.  Pregnancy history. What immunizations do I need? Vaccines are usually given at various ages, according to a schedule. Your health care provider will recommend vaccines for you based on your age, medical history, and lifestyle or other factors, such as travel or where you work.   What tests do I need? Blood tests  Lipid and cholesterol levels. These may be checked every 5 years, or more often if you are over 84 years old.  Hepatitis C test.  Hepatitis B test. Screening  Lung cancer screening. You may have this screening every year starting at age 101 if you have a 30-pack-year history of smoking and currently smoke or have quit within the past 15 years.  Colorectal cancer screening. ? All adults should have this screening starting at age 63 and continuing until age 72. ? Your  health care provider may recommend screening at age 26 if you are at increased risk. ? You will have tests every 1-10 years, depending on your results and the type of screening test.  Diabetes screening. ? This is done by checking your blood sugar (glucose) after you have not eaten for a while (fasting). ? You may have this done every 1-3 years.  Mammogram. ? This may be done every 1-2 years. ? Talk with your health care provider about when you should start having regular mammograms. This may depend on whether you have a family history of breast cancer.  BRCA-related cancer screening. This may be done if you have a family history of breast, ovarian, tubal, or peritoneal cancers.  Pelvic exam and Pap test. ? This may be done every 3 years starting at age 8. ? Starting at age 67, this may be done every 5 years  if you have a Pap test in combination with an HPV test. Other tests  STD (sexually transmitted disease) testing, if you are at risk.  Bone density scan. This is done to screen for osteoporosis. You may have this scan if you are at high risk for osteoporosis. Talk with your health care provider about your test results, treatment options, and if necessary, the need for more tests. Follow these instructions at home: Eating and drinking  Eat a diet that includes fresh fruits and vegetables, whole grains, lean protein, and low-fat dairy products.  Take vitamin and mineral supplements as recommended by your health care provider.  Do not drink alcohol if: ? Your health care provider tells you not to drink. ? You are pregnant, may be pregnant, or are planning to become pregnant.  If you drink alcohol: ? Limit how much you have to 0-1 drink a day. ? Be aware of how much alcohol is in your drink. In the [place] one drink equals one 12 oz bottle of beer (355 mL), one 5 oz glass of wine (148 mL), or one 1 oz glass of hard liquor (44 mL).   Lifestyle  Take daily care of your teeth and gums. Brush your teeth every morning and night with fluoride toothpaste. Floss one time each day.  Stay active. Exercise for at least 30 minutes 5 or more days each week.  Do not use any products that contain nicotine or tobacco, such as cigarettes, e-cigarettes, and chewing tobacco. If you need help quitting, ask your health care provider.  Do not use drugs.  If you are sexually active, practice safe sex. Use a condom or other form of protection to prevent STIs (sexually transmitted infections).  If you do not wish to become pregnant, use a form of birth control. If you plan to become pregnant, see your health care provider for a prepregnancy visit.  If told by your health care provider, take low-dose aspirin daily starting at age 3.  Find healthy ways to cope with stress, such as: ? Meditation, yoga, or  listening to music. ? Journaling. ? Talking to a trusted person. ? Spending time with friends and family. Safety  Always wear your seat belt while driving or riding in a vehicle.  Do not drive: ? If you have been drinking alcohol. Do not ride with someone who has been drinking. ? When you are tired or distracted. ? While texting.  Wear a helmet and other protective equipment during sports activities.  If you have firearms in your house, make sure you follow all gun safety procedures. What's next?  Visit your health care provider once a year for an annual wellness visit.  Ask your health care provider how often you should have your eyes and teeth checked.  Stay up to date on all vaccines. This information is not intended to replace advice given to you by your health care provider. Make sure you discuss any questions you have with your health care provider. Document Revised: 01/16/2020 Document Reviewed: 12/23/2017 Elsevier Patient Education  2021 [employer].

## 2020-10-03 LAB — HEPATITIS C ANTIBODY
Hepatitis C Ab: NONREACTIVE
SIGNAL TO CUT-OFF: 0.01 (ref <1.00)

## 2020-12-31 ENCOUNTER — Ambulatory Visit: Payer: 59 | Admitting: Registered Nurse

## 2020-12-31 ENCOUNTER — Other Ambulatory Visit: Payer: Self-pay

## 2020-12-31 ENCOUNTER — Encounter: Payer: Self-pay | Admitting: Registered Nurse

## 2020-12-31 VITALS — BP 116/72 | HR 71 | Temp 98.3°F | Resp 16 | Wt 213.8 lb

## 2020-12-31 DIAGNOSIS — R300 Dysuria: Secondary | ICD-10-CM

## 2020-12-31 DIAGNOSIS — J302 Other seasonal allergic rhinitis: Secondary | ICD-10-CM

## 2020-12-31 LAB — POCT URINALYSIS DIPSTICK
Bilirubin, UA: NEGATIVE
Blood, UA: POSITIVE
Glucose, UA: NEGATIVE
Ketones, UA: NEGATIVE
Nitrite, UA: POSITIVE
Protein, UA: NEGATIVE
Spec Grav, UA: 1.02 (ref 1.010–1.025)
Urobilinogen, UA: 0.2 E.U./dL
pH, UA: 6 (ref 5.0–8.0)

## 2020-12-31 MED ORDER — AZELASTINE HCL 0.1 % NA SOLN: 1.0000 | Freq: Two times a day (BID) | NASAL | 12 refills | Status: DC

## 2020-12-31 MED ORDER — SULFAMETHOXAZOLE-TRIMETHOPRIM 800-160 MG PO TABS: 1.0000 | ORAL_TABLET | Freq: Two times a day (BID) | ORAL | 0 refills | Status: DC

## 2020-12-31 MED ORDER — AZELASTINE HCL 0.05 % OP SOLN: 1.0000 [drp] | Freq: Two times a day (BID) | OPHTHALMIC | 12 refills | Status: DC

## 2020-12-31 NOTE — Progress Notes (Signed)
Established Patient Office Visit  Subjective:  Patient ID: Nichole Perkins, female    DOB: 04/16/1986  Age: 36 y.o. MRN: [id number]  CC:  Chief Complaint  Patient presents with   Urinary Tract Infection    Patient states that when she urinates she has a painful, burning sensation. Had some nausea   Allergic Rhinitis     Nose, eyes and ears very itchy. Worse in the morning    HPI Nichole Perkins presents for UTI and rhinitis  UTI - Symptoms started 8 days ago Includes: dysuria, some nausea, suprapubic pain Denies vomiting, fever, flank pain, vaginal symptoms  Rhinitis -  Ongoing Has not tried treatment In remote past had done OTC antihistamine  No past medical history on file.  Past Surgical History:  Procedure Laterality Date   CESAREAN SECTION     CESAREAN SECTION N/A 12/01/2016   Procedure: CESAREAN SECTION;  Surgeon: Levie Heritage, DO;  Location: [facility] BIRTHING SUIT[facility];  Service: Obstetrics;  Laterality: N/A;   CESAREAN SECTION     WRIST SURGERY Left 2010   growth removed    Family History  Problem Relation Age of Onset   Hypertension Mother    Diabetes Father    Hypertension Father    Hypertension Brother     Social History   Socioeconomic History   Marital status: Married    Spouse name: Not on file   Number of children: 3   Years of education: Not on file   Highest education level: Not on file  Occupational History   Not on file  Tobacco Use   Smoking status: Never   Smokeless tobacco: Never  Vaping Use   Vaping Use: Never used  Substance and Sexual Activity   Alcohol use: Yes    Comment: occ   Drug use: No   Sexual activity: Yes    Birth control/protection: None, I.U.D.  Other Topics Concern   Not on file  Social History Narrative   Not on file   Social Determinants of Health   Financial Resource Strain: Not on file  Food Insecurity: Not on file  Transportation Needs: Not on file  Physical Activity: Not on file  Stress: Not on file  Social  Connections: Not on file  Intimate Partner Violence: Not on file    Outpatient Medications Prior to Visit  Medication Sig Dispense Refill   Multiple Vitamin (MULTI-DAY PO) Take by mouth.     No facility-administered medications prior to visit.    No Known Allergies  ROS Review of Systems Per hpi    Objective:    Physical Exam Vitals and nursing note reviewed.  Constitutional:      General: She is not in acute distress.    Appearance: Normal appearance. She is normal weight. She is not ill-appearing, toxic-appearing or diaphoretic.  HENT:     Head: Normocephalic and atraumatic.     Nose: Rhinorrhea present. No congestion.     Mouth/Throat:     Mouth: Mucous membranes are moist.     Pharynx: Oropharynx is clear. No oropharyngeal exudate or posterior oropharyngeal erythema.  Cardiovascular:     Rate and Rhythm: Normal rate and regular rhythm.     Heart sounds: Normal heart sounds. No murmur heard.   No friction rub. No gallop.  Pulmonary:     Effort: Pulmonary effort is normal. No respiratory distress.     Breath sounds: Normal breath sounds. No stridor. No wheezing, rhonchi or rales.  Chest:  Chest wall: No tenderness.  Abdominal:     Tenderness: There is no right CVA tenderness or left CVA tenderness.  Skin:    General: Skin is warm and dry.  Neurological:     General: No focal deficit present.     Mental Status: She is alert and oriented to person, place, and time. Mental status is at baseline.  Psychiatric:        Mood and Affect: Mood normal.        Behavior: Behavior normal.        Thought Content: Thought content normal.        Judgment: Judgment normal.    BP 116/72   Pulse 71   Temp 98.3 F (36.8 C) (Temporal)   Resp 16   Wt 213 lb 12.8 oz (97 kg)   SpO2 98%   BMI 35.58 kg/m  Wt Readings from Last 3 Encounters:  12/31/20 213 lb 12.8 oz (97 kg)  10/02/20 216 lb (98 kg)  12/26/19 198 lb (89.8 kg)     Health Maintenance Due  Topic Date Due    COVID-19 Vaccine (1) Never done   INFLUENZA VACCINE  11/25/2020    There are no preventive care reminders to display for this patient.  Lab Results  Component Value Date   TSH 1.09 10/02/2020   Lab Results  Component Value Date   WBC 4.6 10/02/2020   HGB 12.8 10/02/2020   HCT 38.5 10/02/2020   MCV 91.9 10/02/2020   PLT 237.0 10/02/2020   Lab Results  Component Value Date   NA 137 10/02/2020   K 4.4 10/02/2020   CO2 24 10/02/2020   GLUCOSE 132 (H) 10/02/2020   BUN 11 10/02/2020   CREATININE 0.70 10/02/2020   BILITOT 0.3 10/02/2020   ALKPHOS 68 10/02/2020   AST 35 10/02/2020   ALT 52 (H) 10/02/2020   PROT 7.0 10/02/2020   ALBUMIN 4.2 10/02/2020   CALCIUM 8.9 10/02/2020   ANIONGAP 8 12/27/2018   GFR 111.30 10/02/2020   Lab Results  Component Value Date   CHOL 135 10/02/2020   Lab Results  Component Value Date   HDL 40.50 10/02/2020   Lab Results  Component Value Date   LDLCALC 80 10/02/2020   Lab Results  Component Value Date   TRIG 71.0 10/02/2020   Lab Results  Component Value Date   CHOLHDL 3 10/02/2020   Lab Results  Component Value Date   HGBA1C 6.4 10/02/2020      Assessment & Plan:   Problem List Items Addressed This Visit   None Visit Diagnoses     Dysuria    -  Primary   Relevant Medications   sulfamethoxazole-trimethoprim (BACTRIM DS) 800-160 MG tablet   Other Relevant Orders   POCT Urinalysis Dipstick (Completed)   Urine Culture   Seasonal allergies       Relevant Medications   azelastine (ASTELIN) 0.1 % nasal spray   azelastine (OPTIVAR) 0.05 % ophthalmic solution   azelastine (ASTELIN) 0.1 % nasal spray       Meds ordered this encounter  Medications   azelastine (ASTELIN) 0.1 % nasal spray    Sig: Place 1 spray into both nostrils 2 (two) times daily. Use in each nostril as directed    Dispense:  30 mL    Refill:  12    Order Specific Question:   Supervising Provider    Answer:   Neva Seat, JEFFREY R [[id number]]    azelastine (OPTIVAR) 0.05 % ophthalmic solution  Sig: Place 1 drop into both eyes 2 (two) times daily.    Dispense:  6 mL    Refill:  12    Order Specific Question:   Supervising Provider    Answer:   Neva Seat, JEFFREY R [[id number]]   azelastine (ASTELIN) 0.1 % nasal spray    Sig: Place 1 spray into both nostrils 2 (two) times daily. Use in each nostril as directed    Dispense:  30 mL    Refill:  12    Order Specific Question:   Supervising Provider    Answer:   Neva Seat, JEFFREY R [[id number]]   sulfamethoxazole-trimethoprim (BACTRIM DS) 800-160 MG tablet    Sig: Take 1 tablet by mouth 2 (two) times daily.    Dispense:  6 tablet    Refill:  0    Order Specific Question:   Supervising Provider    Answer:   Neva Seat, JEFFREY R [[id number]]    Follow-up: No follow-ups on file.   PLAN Bactrim po bid for three days Culture sent Return if worsening or failing to improve Sent azelastine nasal spray and eye drops, flonase nasal spray. Ok to continue OTC antihistamine Patient encouraged to call clinic with any questions, comments, or concerns.  Janeece Agee, NP

## 2021-01-02 LAB — URINE CULTURE
MICRO NUMBER:: 12337772
SPECIMEN QUALITY:: ADEQUATE

## 2021-01-02 LAB — HOUSE ACCOUNT TRACKING

## 2021-03-26 ENCOUNTER — Encounter: Payer: Self-pay | Admitting: Registered Nurse

## 2021-03-26 DIAGNOSIS — Z111 Encounter for screening for respiratory tuberculosis: Secondary | ICD-10-CM

## 2021-03-31 NOTE — Telephone Encounter (Signed)
Looks like she needs PPD or quantiferon gold per paperwork. Rest of it has been filled out. If we could get her nurse visit(s) for these that would be great. If she opts for quantiferon gold, use the one with code [id number]  Thanks  Luan Pulling

## 2021-04-07 ENCOUNTER — Other Ambulatory Visit: Payer: 59

## 2021-04-07 DIAGNOSIS — Z111 Encounter for screening for respiratory tuberculosis: Secondary | ICD-10-CM

## 2021-04-09 ENCOUNTER — Other Ambulatory Visit: Payer: Self-pay | Admitting: Registered Nurse

## 2021-04-09 DIAGNOSIS — R7612 Nonspecific reaction to cell mediated immunity measurement of gamma interferon antigen response without active tuberculosis: Secondary | ICD-10-CM

## 2021-04-09 LAB — QUANTIFERON-TB GOLD PLUS
Mitogen-NIL: >10 IU/mL
NIL: 0.08 IU/mL
QuantiFERON-TB Gold Plus: POSITIVE — AB
TB1-NIL: 0.48 IU/mL
TB2-NIL: 0.53 IU/mL

## 2021-04-11 ENCOUNTER — Other Ambulatory Visit: Payer: Self-pay

## 2021-04-11 ENCOUNTER — Ambulatory Visit
Admission: RE | Admit: 2021-04-11 | Discharge: 2021-04-11 | Disposition: A | Discharge status: Home / Self Care | Payer: 59 | Source: Ambulatory Visit | Attending: Registered Nurse | Admitting: Registered Nurse

## 2021-04-11 DIAGNOSIS — R7611 Nonspecific reaction to tuberculin skin test without active tuberculosis: Secondary | ICD-10-CM | POA: Diagnosis not present

## 2021-04-11 DIAGNOSIS — R7612 Nonspecific reaction to cell mediated immunity measurement of gamma interferon antigen response without active tuberculosis: Secondary | ICD-10-CM

## 2021-04-15 ENCOUNTER — Encounter: Payer: Self-pay | Admitting: Registered Nurse

## 2021-04-30 ENCOUNTER — Other Ambulatory Visit: Payer: Self-pay | Admitting: Registered Nurse

## 2021-04-30 ENCOUNTER — Telehealth: Payer: Self-pay | Admitting: Registered Nurse

## 2021-04-30 DIAGNOSIS — Z789 Other specified health status: Secondary | ICD-10-CM

## 2021-04-30 DIAGNOSIS — Z9229 Personal history of other drug therapy: Secondary | ICD-10-CM

## 2021-04-30 NOTE — Telephone Encounter (Signed)
Pt called in asking if she can come in to have a vericella and MMR Titer done. She needs to have this done to complete school forms. She needs this done right away, please advise if this can be done here at our office.  [phone]

## 2021-04-30 NOTE — Telephone Encounter (Signed)
Futured  Thanks,  Retail banker

## 2021-04-30 NOTE — Telephone Encounter (Signed)
Can you future order these if acceptable

## 2021-04-30 NOTE — Telephone Encounter (Signed)
LM making pt aware that she can schedule an appt with the lab

## 2021-04-30 NOTE — Telephone Encounter (Signed)
Pt okay to have titers done

## 2021-05-01 ENCOUNTER — Other Ambulatory Visit: Payer: Self-pay | Admitting: Registered Nurse

## 2021-05-01 ENCOUNTER — Other Ambulatory Visit: Payer: 59

## 2021-05-01 ENCOUNTER — Telehealth: Payer: Self-pay

## 2021-05-01 DIAGNOSIS — Z789 Other specified health status: Secondary | ICD-10-CM

## 2021-05-01 DIAGNOSIS — Z9229 Personal history of other drug therapy: Secondary | ICD-10-CM

## 2021-05-01 NOTE — Telephone Encounter (Signed)
Yes she wants to see if they have been done

## 2021-05-01 NOTE — Telephone Encounter (Signed)
Can you also order a Hep B and Tetanus titer? I have never ordered titer for tetanus before and I am unsure what one to order

## 2021-05-01 NOTE — Telephone Encounter (Signed)
Caller name:Nichole Perkins   On DPR? :No  Call back number:[phone]  Provider they see: Richard  Reason for call:Pt is coming in today for labs and needs Hep B and Tdap added to her labs

## 2021-05-01 NOTE — Telephone Encounter (Signed)
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

## 2021-05-01 NOTE — Telephone Encounter (Signed)
Tdap up to date - documented in chart. Hep B titer ordered. For future reference - Hep B surface ab  Thanks,  Writer

## 2021-05-01 NOTE — Telephone Encounter (Signed)
Does she want lab work to check these or immunizations?

## 2021-05-02 LAB — MEASLES/MUMPS/RUBELLA IMMUNITY
Mumps IgG: 125 AU/mL
Rubella: 1.43 Index
Rubeola IgG: 147 AU/mL

## 2021-05-02 LAB — VARICELLA ZOSTER ANTIBODY, IGG: Varicella IgG: 647.4 index

## 2021-05-27 ENCOUNTER — Telehealth: Payer: Self-pay

## 2021-05-27 DIAGNOSIS — Z1159 Encounter for screening for other viral diseases: Secondary | ICD-10-CM

## 2021-05-27 NOTE — Telephone Encounter (Signed)
Ok to place  Thanks,  Retail banker

## 2021-05-27 NOTE — Telephone Encounter (Signed)
Caller name:Aulani Tholl   On DPR? 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

## 2021-05-27 NOTE — Telephone Encounter (Signed)
Okay for [place] to place orders?

## 2021-05-27 NOTE — Telephone Encounter (Signed)
Called and made appt for labs

## 2021-05-28 ENCOUNTER — Other Ambulatory Visit: Payer: 59

## 2021-05-28 DIAGNOSIS — Z1159 Encounter for screening for other viral diseases: Secondary | ICD-10-CM | POA: Diagnosis not present

## 2021-05-29 LAB — RUBEOLA ANTIBODY IGG: Rubeola IgG: 160 AU/mL

## 2021-05-29 LAB — HEPATITIS B CORE ANTIBODY, IGM: Hep B C IgM: NONREACTIVE

## 2021-08-28 ENCOUNTER — Encounter: Payer: Self-pay | Admitting: Registered Nurse

## 2021-11-27 ENCOUNTER — Encounter: Payer: Self-pay | Admitting: Family Medicine

## 2021-11-27 ENCOUNTER — Ambulatory Visit (INDEPENDENT_AMBULATORY_CARE_PROVIDER_SITE_OTHER): Payer: 59 | Admitting: Family Medicine

## 2021-11-27 VITALS — BP 130/80 | HR 77 | Temp 97.6°F | Resp 16 | Ht 65.0 in | Wt 214.4 lb

## 2021-11-27 DIAGNOSIS — E669 Obesity, unspecified: Secondary | ICD-10-CM

## 2021-11-27 DIAGNOSIS — Z0000 Encounter for general adult medical examination without abnormal findings: Secondary | ICD-10-CM

## 2021-11-27 LAB — VITAMIN D 25 HYDROXY (VIT D DEFICIENCY, FRACTURES): VITD: 23.3 ng/mL — ABNORMAL LOW (ref 30.00–100.00)

## 2021-11-27 LAB — CBC WITH DIFFERENTIAL/PLATELET
Basophils Absolute: 0 10*3/uL (ref 0.0–0.1)
Basophils Relative: 0.6 % (ref 0.0–3.0)
Eosinophils Absolute: 0.2 10*3/uL (ref 0.0–0.7)
Eosinophils Relative: 4.8 % (ref 0.0–5.0)
HCT: 37.5 % (ref 36.0–46.0)
Hemoglobin: 12.5 g/dL (ref 12.0–15.0)
Lymphocytes Relative: 33.7 % (ref 12.0–46.0)
Lymphs Abs: 1.5 10*3/uL (ref 0.7–4.0)
MCHC: 33.3 g/dL (ref 30.0–36.0)
MCV: 90.6 fl (ref 78.0–100.0)
Monocytes Absolute: 0.3 10*3/uL (ref 0.1–1.0)
Monocytes Relative: 5.8 % (ref 3.0–12.0)
Neutro Abs: 2.5 10*3/uL (ref 1.4–7.7)
Neutrophils Relative %: 55.1 % (ref 43.0–77.0)
Platelets: 249 10*3/uL (ref 150.0–400.0)
RBC: 4.14 Mil/uL (ref 3.87–5.11)
RDW: 13.1 % (ref 11.5–15.5)
WBC: 4.5 10*3/uL (ref 4.0–10.5)

## 2021-11-27 LAB — TSH: TSH: 0.88 u[IU]/mL (ref 0.35–5.50)

## 2021-11-27 LAB — LIPID PANEL
Cholesterol: 134 mg/dL (ref 0–200)
HDL: 42.4 mg/dL (ref >39.00)
LDL Cholesterol: 78 mg/dL (ref 0–99)
NonHDL: 91.87
Total CHOL/HDL Ratio: 3
Triglycerides: 69 mg/dL (ref 0.0–149.0)
VLDL: 13.8 mg/dL (ref 0.0–40.0)

## 2021-11-27 LAB — BASIC METABOLIC PANEL
BUN: 10 mg/dL (ref 6–23)
CO2: 26 mEq/L (ref 19–32)
Calcium: 9.3 mg/dL (ref 8.4–10.5)
Chloride: 102 mEq/L (ref 96–112)
Creatinine, Ser: 0.71 mg/dL (ref 0.40–1.20)
GFR: 108.54 mL/min (ref >60.00)
Glucose, Bld: 125 mg/dL — ABNORMAL HIGH (ref 70–99)
Potassium: 3.9 mEq/L (ref 3.5–5.1)
Sodium: 136 mEq/L (ref 135–145)

## 2021-11-27 LAB — HEPATIC FUNCTION PANEL
ALT: 19 U/L (ref 0–35)
AST: 18 U/L (ref 0–37)
Albumin: 4.2 g/dL (ref 3.5–5.2)
Alkaline Phosphatase: 72 U/L (ref 39–117)
Bilirubin, Direct: 0.1 mg/dL (ref 0.0–0.3)
Total Bilirubin: 0.3 mg/dL (ref 0.2–1.2)
Total Protein: 7.4 g/dL (ref 6.0–8.3)

## 2021-11-27 NOTE — Assessment & Plan Note (Signed)
Pt's PE WNL w/ exception of obesity.  Due for pap- encouraged pt to call GYN.  UTD on Tdap.  Check labs.  Anticipatory guidance provided.

## 2021-11-27 NOTE — Progress Notes (Signed)
   Subjective:    Patient ID: Nichole Perkins, female    DOB: 1986/02/21, 36 y.o.   MRN: [id number]  HPI CPE- UTD on Tdap, due for pap  Health Maintenance  Topic Date Due   PAP SMEAR-Modifier  07/08/2021   INFLUENZA VACCINE  11/25/2021   TETANUS/TDAP  09/11/2026   Hepatitis C Screening  Completed   HIV Screening  Completed   HPV VACCINES  Aged Out   COVID-19 Vaccine  Discontinued     Review of Systems Patient reports no vision/ hearing changes, adenopathy,fever, weight change,  persistant/recurrent hoarseness , swallowing issues, chest pain, palpitations, edema, persistant/recurrent cough, hemoptysis, dyspnea (rest/exertional/paroxysmal nocturnal), gastrointestinal bleeding (melena, rectal bleeding), abdominal pain, significant heartburn, bowel changes, GU symptoms (dysuria, hematuria, incontinence), Gyn symptoms (abnormal  bleeding, pain),  syncope, focal weakness, memory loss, numbness & tingling, skin/hair/nail changes, abnormal bruising or bleeding, anxiety, or depression.     Objective:   Physical Exam General Appearance:    Alert, cooperative, no distress, appears stated age, obese  Head:    Normocephalic, without obvious abnormality, atraumatic  Eyes:    PERRL, conjunctiva/corneas clear, EOM's intact both eyes  Ears:    Normal TM's and external ear canals, both ears  Nose:   Nares normal, septum midline, mucosa normal, no drainage    or sinus tenderness  Throat:   Lips, mucosa, and tongue normal; teeth and gums normal  Neck:   Supple, symmetrical, trachea midline, no adenopathy;    Thyroid: no enlargement/tenderness/nodules  Back:     Symmetric, no curvature, ROM normal, no CVA tenderness  Lungs:     Clear to auscultation bilaterally, respirations unlabored  Chest Wall:    No tenderness or deformity   Heart:    Regular rate and rhythm, S1 and S2 normal, no murmur, rub   or gallop  Breast Exam:    Deferred to GYN  Abdomen:     Soft, non-tender, bowel sounds active all four  quadrants,    no masses, no organomegaly  Genitalia:    Deferred to GYN  Rectal:    Extremities:   Extremities normal, atraumatic, no cyanosis or edema  Pulses:   2+ and symmetric all extremities  Skin:   Skin color, texture, turgor normal, no rashes or lesions  Lymph nodes:   Cervical, supraclavicular, and axillary nodes normal  Neurologic:   CNII-XII intact, normal strength, sensation and reflexes    throughout          Assessment & Plan:

## 2021-11-27 NOTE — Patient Instructions (Addendum)
Follow up as needed or as scheduled When you find your new Primary Care Physician, you are due for a pap (or call gyn) Continue to work on healthy diet and regular exercise- you can do it!!! Call with any questions or concerns Stay Safe!  Stay Healthy! Enjoy the rest of your summer!!!

## 2021-11-28 ENCOUNTER — Other Ambulatory Visit (INDEPENDENT_AMBULATORY_CARE_PROVIDER_SITE_OTHER): Payer: 59

## 2021-11-28 ENCOUNTER — Other Ambulatory Visit: Payer: Self-pay

## 2021-11-28 ENCOUNTER — Telehealth: Payer: Self-pay

## 2021-11-28 DIAGNOSIS — R7309 Other abnormal glucose: Secondary | ICD-10-CM

## 2021-11-28 LAB — HEMOGLOBIN A1C: Hgb A1c MFr Bld: 6.5 % (ref 4.6–6.5)

## 2021-11-28 MED ORDER — VITAMIN D (ERGOCALCIFEROL) 1.25 MG (50000 UNIT) PO CAPS: 50000.0000 [IU] | ORAL_CAPSULE | ORAL | 12 refills | Status: AC

## 2021-11-28 NOTE — Telephone Encounter (Signed)
Called and LM with spouse to have her call back to discuss labs

## 2021-11-28 NOTE — Progress Notes (Signed)
Left pt a VM about her labs . Sent VIT d to pharmacy

## 2021-11-28 NOTE — Telephone Encounter (Signed)
-----   Message from Sheliah Hatch, MD sent at 11/28/2021  4:15 PM EDT ----- Your are right on the cusp of diabetes at 6.5%  Please make sure you are working on a low carb/low sugar diet and regular exercise to prevent this from climbing even a little bit.  B/c at that point, it would officially be diabetes.

## 2022-02-12 ENCOUNTER — Encounter: Payer: Self-pay | Admitting: Family Medicine

## 2022-02-12 ENCOUNTER — Ambulatory Visit (INDEPENDENT_AMBULATORY_CARE_PROVIDER_SITE_OTHER): Payer: 59 | Admitting: Family Medicine

## 2022-02-12 ENCOUNTER — Telehealth: Payer: Self-pay | Admitting: Registered Nurse

## 2022-02-12 VITALS — BP 124/80 | HR 69 | Temp 98.1°F | Resp 16 | Ht 65.0 in | Wt 217.0 lb

## 2022-02-12 DIAGNOSIS — M545 Low back pain, unspecified: Secondary | ICD-10-CM | POA: Diagnosis not present

## 2022-02-12 DIAGNOSIS — M546 Pain in thoracic spine: Secondary | ICD-10-CM | POA: Diagnosis not present

## 2022-02-12 MED ORDER — PREDNISONE 10 MG PO TABS: ORAL_TABLET | ORAL | 0 refills | Status: DC

## 2022-02-12 MED ORDER — METHOCARBAMOL 500 MG PO TABS: 500.0000 mg | ORAL_TABLET | Freq: Three times a day (TID) | ORAL | 0 refills | Status: DC | PRN

## 2022-02-12 NOTE — Patient Instructions (Signed)
Follow up as needed or as scheduled START the Prednisone as directed- 3 at the same time x3 days, and then 2 at the same time x3 days, and then 1 daily USE the Methocarbamol as needed for pain/spasm ADD Tylenol (acetaminophen) as needed for breakthrough pain HEAT! Gentle stretching! Call with any questions or concerns Hang in there!!

## 2022-02-12 NOTE — Telephone Encounter (Signed)
Pt was asking if you would take her on as a new pt

## 2022-02-12 NOTE — Progress Notes (Signed)
   Subjective:    Patient ID: Nichole Perkins, female    DOB: 1985/08/29, 36 y.o.   MRN: [id number]  HPI Back pain- She worked Tuesday and subsequently developed pain in her mid back above her bra line.  She took Tylenol and went to bed.  Then she felt that the pain was spreading- radiating around her ribs to sternum.  Pain has now moved to lower back.  Pt is having pain from neck to buttocks.  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  Lots of lifting, pulling, tugging.  No HAs.  Pain is worse when sitting, difficulty bending forward to lift.   Review of Systems For ROS see HPI     Objective:   Physical Exam Vitals reviewed.  Constitutional:      General: She is not in acute distress.    Appearance: She is not ill-appearing.     Comments: Obviously uncomfortable  HENT:     Head: Normocephalic and atraumatic.  Eyes:     Extraocular Movements: Extraocular movements intact.     Conjunctiva/sclera: Conjunctivae normal.     Pupils: Pupils are equal, round, and reactive to light.  Musculoskeletal:        General: Tenderness (TTP from traps down to buttocks w/ palpable muscle spasm) present.     Cervical back: Normal range of motion and neck supple. Tenderness (TTP over traps bilaterally) present.  Skin:    General: Skin is warm and dry.     Findings: No rash.  Neurological:     General: No focal deficit present.     Mental Status: She is alert and oriented to person, place, and time.     Comments: + SLR bilaterally  Psychiatric:        Mood and Affect: Mood normal.        Behavior: Behavior normal.        Thought Content: Thought content normal.           Assessment & Plan:   Acute back pain- new.  Cervical, thoracic, and lumbar.  Palpable muscle spasm of traps and paraspinal muscles.  Thankfully no weakness or numbness of legs, no bowel or bladder incontinence.  Start Prednisone taper and Methocarbamol prn.  Encouraged heat and gentle stretching.  Note provided to miss work Midwife.  Pt  expressed understanding and is in agreement w/ plan.

## 2022-02-13 NOTE — Telephone Encounter (Signed)
Ok to establish care.

## 2022-02-13 NOTE — Telephone Encounter (Signed)
Called patient no answer was unable to leave a message

## 2022-02-13 NOTE — Telephone Encounter (Signed)
Okay to establish.

## 2022-03-26 ENCOUNTER — Other Ambulatory Visit (INDEPENDENT_AMBULATORY_CARE_PROVIDER_SITE_OTHER): Payer: 59

## 2022-03-26 DIAGNOSIS — Z111 Encounter for screening for respiratory tuberculosis: Secondary | ICD-10-CM

## 2022-03-28 LAB — QUANTIFERON-TB GOLD PLUS
Mitogen-NIL: >10 IU/mL
NIL: 0.01 IU/mL
QuantiFERON-TB Gold Plus: POSITIVE — AB
TB1-NIL: 0.42 IU/mL
TB2-NIL: 0.51 IU/mL

## 2022-04-07 ENCOUNTER — Telehealth: Payer: Self-pay | Admitting: Lab

## 2022-04-07 DIAGNOSIS — R7612 Nonspecific reaction to cell mediated immunity measurement of gamma interferon antigen response without active tuberculosis: Secondary | ICD-10-CM

## 2022-04-07 DIAGNOSIS — Z111 Encounter for screening for respiratory tuberculosis: Secondary | ICD-10-CM

## 2022-04-07 NOTE — Telephone Encounter (Signed)
Spoke to husband and he said he would get her to call back

## 2022-04-07 NOTE — Telephone Encounter (Signed)
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  Depending on the reason of obtaining this test, we may need to have her seen by infectious disease to decide on further testing.  Please clarify and we can decide next step.  Thanks

## 2022-04-08 NOTE — Telephone Encounter (Signed)
Patient called to back with Nichole Perkins. Best contact number is [phone].

## 2022-04-08 NOTE — Telephone Encounter (Signed)
Called back no answer 

## 2022-04-09 NOTE — Telephone Encounter (Signed)
Chest x-ray ordered at [facility]: Nichole Rhonda W.  Referral placed to infectious disease.  Please advise her of the location for [place] [facility] x-ray and let me know if there are further questions.

## 2022-04-09 NOTE — Telephone Encounter (Signed)
Mailed letter with instructions to return call

## 2022-04-09 NOTE — Telephone Encounter (Signed)
Patient called back and would like for you to order both the Xray and the referral notes she received partial treatment about 2 years ago and thinks she needs to finish the treatment now.  Please let me know

## 2022-04-09 NOTE — Telephone Encounter (Signed)
LM to call back will be sending a lab letter due to 3 missed contact opportunities

## 2022-04-09 NOTE — Progress Notes (Signed)
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 Depending on the reason of obtaining this test, we may need to have her seen by infectious disease to decide on further testing. Please clarify and we can decide next step. Thanks

## 2022-04-10 NOTE — Telephone Encounter (Signed)
Attempted a phone call to pt no answer lm

## 2022-04-13 NOTE — Telephone Encounter (Signed)
Again no answer, LM asking her to return call

## 2022-04-14 NOTE — Telephone Encounter (Signed)
Again no answer will send letter to have pt contact us

## 2022-05-04 NOTE — Progress Notes (Unsigned)
[facility] Infectious [facility]                                      [address], [place], [place], [address]                                               Phn. [phone]; Fax: [phone]                                                               Date:  Reason for Visit: Positive Quantiferon  Requesting Provider:    HPI: Nichole Perkins is a 37 y.o.old female with   Denies cough more than 2 weeks, night sweats, SOB, loss of appetite and loss of weight. Risk factor for reactivation    ROS: Denies yellowish discoloration of sclera and skin, abdominal pain/distension, hematemesis.            Dcough, fever, chills, nightsweats, nausea, vomiting, diarrhea, constipation, weight loss, recent hospitalizations, rashes, joint complaints, shortness of breath, chest pain, headaches, dysuria .   @[facility] @  No Known Allergies  No past medical history on file.  Past Surgical History:  Procedure Laterality Date   CESAREAN SECTION     CESAREAN SECTION N/A 12/01/2016   Procedure: CESAREAN SECTION;  Surgeon: Truett Mainland, DO;  Location: [facility];  Service: Obstetrics;  Laterality: N/A;   CESAREAN SECTION     WRIST SURGERY Left 2010   growth removed     Social  Family  Physical exam: There were no vitals taken for this visit.  Gen: Alert and oriented x 3, no acute distress HEENT: [place]/AT, PERL, EOMI, no scleral icterus, no pale conjunctivae, hearing normal, oral mucosa moist Neck: Supple, no lymphadenopathy Cardio: Regular rate and rhythm; +S1 and S2; no murmurs, gallops, or rubs Resp: CTAB; no wheezes, rhonchi, or rales GI: Soft, nontender, nondistended, bowel sounds present GU: Musc: Extremities: No cyanosis, clubbing, or edema; +2 PT and DP pulses Skin: No rashes, lesions, or ecchymoses Neuro: No focal deficits; CNs II-XII intact; sensation intact; +5/5 MMS b/l UEs and LEs Psych: Calm,  cooperative   Laboratory  HIV    Pertinent Imaging  Assessment/Plan:  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 Professional time spent includes the following activities: Preparing to see the patient (review of tests), Obtaining and/or reviewing separately obtained history (admission/discharge record), Performing a medically appropriate examination and/or evaluation , Ordering medications/tests/procedures, referring and communicating with other health care professionals, Documenting clinical information in the EMR, Independently interpreting results (not separately reported), Communicating results to the patient/family/caregiver, Counseling and educating the patient/family/caregiver and Care coordination (not separately reported).    Patients questions were addressed and answered.  Electronically signed by:  Odette Fraction, MD Infectious Diseases  Office phone [phone] Fax no. [phone]

## 2022-05-05 ENCOUNTER — Telehealth: Payer: Self-pay

## 2022-05-05 ENCOUNTER — Ambulatory Visit (INDEPENDENT_AMBULATORY_CARE_PROVIDER_SITE_OTHER): Payer: 59 | Admitting: Infectious Diseases

## 2022-05-05 ENCOUNTER — Encounter: Payer: Self-pay | Admitting: Infectious Diseases

## 2022-05-05 ENCOUNTER — Other Ambulatory Visit: Payer: Self-pay

## 2022-05-05 VITALS — BP 139/83 | HR 64 | Temp 98.5°F | Ht 65.0 in | Wt 221.0 lb

## 2022-05-05 DIAGNOSIS — Z114 Encounter for screening for human immunodeficiency virus [HIV]: Secondary | ICD-10-CM

## 2022-05-05 DIAGNOSIS — Z79899 Other long term (current) drug therapy: Secondary | ICD-10-CM | POA: Diagnosis not present

## 2022-05-05 DIAGNOSIS — R7612 Nonspecific reaction to cell mediated immunity measurement of gamma interferon antigen response without active tuberculosis: Secondary | ICD-10-CM | POA: Diagnosis not present

## 2022-05-05 NOTE — Telephone Encounter (Signed)
Epic message sent for patient scheduling

## 2022-05-06 ENCOUNTER — Telehealth: Payer: Self-pay

## 2022-05-06 LAB — COMPREHENSIVE METABOLIC PANEL
AG Ratio: 1.5 (calc) (ref 1.0–2.5)
ALT: 16 U/L (ref 6–29)
AST: 15 U/L (ref 10–30)
Albumin: 4.4 g/dL (ref 3.6–5.1)
Alkaline phosphatase (APISO): 70 U/L (ref 31–125)
BUN: 10 mg/dL (ref 7–25)
CO2: 25 mmol/L (ref 20–32)
Calcium: 9.5 mg/dL (ref 8.6–10.2)
Chloride: 103 mmol/L (ref 98–110)
Creat: 0.72 mg/dL (ref 0.50–0.97)
Globulin: 2.9 g/dL (calc) (ref 1.9–3.7)
Glucose, Bld: 113 mg/dL — ABNORMAL HIGH (ref 65–99)
Potassium: 4.4 mmol/L (ref 3.5–5.3)
Sodium: 137 mmol/L (ref 135–146)
Total Bilirubin: 0.4 mg/dL (ref 0.2–1.2)
Total Protein: 7.3 g/dL (ref 6.1–8.1)

## 2022-05-06 LAB — HIV ANTIBODY (ROUTINE TESTING W REFLEX): HIV 1&2 Ab, 4th Generation: NONREACTIVE

## 2022-05-06 NOTE — Telephone Encounter (Signed)
-----   Message from Rosiland Oz, MD sent at 05/06/2022  4:30 PM EST ----- Regarding: FW:  Please let patient know labs are unremarkable.  Waiting for chest X-ray to be completed for latent TB tx.  ----- Message ----- From: Interface, [facility] Lab Results In Sent: 05/06/2022   2:16 AM EST To: Rosiland Oz, MD

## 2022-05-07 NOTE — Telephone Encounter (Signed)
I spoke to the patient and relayed lab results. Patient also advised to make sure she gets her chest x ray done. Nichole Perkins T Brooks Sailors

## 2022-05-08 ENCOUNTER — Ambulatory Visit (HOSPITAL_COMMUNITY)
Admission: RE | Admit: 2022-05-08 | Discharge: 2022-05-08 | Disposition: A | Discharge status: Home / Self Care | Payer: 59 | Source: Ambulatory Visit | Attending: Family Medicine | Admitting: Family Medicine

## 2022-05-08 DIAGNOSIS — R7612 Nonspecific reaction to cell mediated immunity measurement of gamma interferon antigen response without active tuberculosis: Secondary | ICD-10-CM | POA: Insufficient documentation

## 2022-05-08 DIAGNOSIS — Z111 Encounter for screening for respiratory tuberculosis: Secondary | ICD-10-CM | POA: Insufficient documentation

## 2022-05-11 ENCOUNTER — Other Ambulatory Visit: Payer: Self-pay

## 2022-05-11 ENCOUNTER — Ambulatory Visit (INDEPENDENT_AMBULATORY_CARE_PROVIDER_SITE_OTHER): Payer: 59 | Admitting: Pharmacist

## 2022-05-11 DIAGNOSIS — R7612 Nonspecific reaction to cell mediated immunity measurement of gamma interferon antigen response without active tuberculosis: Secondary | ICD-10-CM

## 2022-05-11 MED ORDER — ISONIAZID 300 MG PO TABS: 900.0000 mg | ORAL_TABLET | ORAL | 2 refills | Status: DC

## 2022-05-11 MED ORDER — RIFAPENTINE 150 MG PO TABS: 900.0000 mg | ORAL_TABLET | ORAL | 2 refills | Status: DC

## 2022-05-11 MED ORDER — VITAMIN B-6 50 MG PO TABS: 50.0000 mg | ORAL_TABLET | ORAL | 2 refills | Status: DC

## 2022-05-11 NOTE — Progress Notes (Signed)
05/11/2022  HPI: Nichole Perkins is a 37 y.o. 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  Patient Active Problem List   Diagnosis Date Noted   Medication management 05/05/2022   Positive QuantiFERON-TB Gold test 05/05/2022   Encounter for screening for HIV 05/05/2022   Physical exam 11/27/2021   Obesity (BMI 30-39.9) 11/27/2021    Patient's Medications  New Prescriptions   No medications on file  Previous Medications   MULTIPLE VITAMIN (MULTI-DAY PO)    Take by mouth.   VITAMIN D, ERGOCALCIFEROL, (DRISDOL) 1.25 MG (50000 UNIT) CAPS CAPSULE    Take 1 capsule (50,000 Units total) by mouth every 7 (seven) days.  Modified Medications   No medications on file  Discontinued Medications   No medications on file    Allergies: No Known Allergies  Past Medical History: No past medical history on file.  Social History: Social History   Socioeconomic History   Marital status: Married    Spouse name: Not on file   Number of children: 3   Years of education: Not on file   Highest education level: Not on file  Occupational History   Not on file  Tobacco Use   Smoking status: Never   Smokeless tobacco: Never  Vaping Use   Vaping Use: Never used  Substance and Sexual Activity   Alcohol use: Not Currently    Comment: occ   Drug use: No   Sexual activity: Yes    Birth control/protection: None, I.U.D.  Other Topics Concern   Not on file  Social History Narrative   Not on file   Social Determinants of Health   Financial Resource Strain: Low Risk  (01/04/2019)   Overall Financial Resource Strain (CARDIA)    Difficulty of Paying Living Expenses: Not hard at all  Food Insecurity: No Food Insecurity (01/04/2019)   Hunger Vital Sign    Worried About Running Out of Food in the Last Year: Never true    Ran Out of Food in the Last Year: Never true  Transportation Needs: No Transportation Needs (01/04/2019)   PRAPARE -  Hydrologist (Medical): No    Lack of Transportation (Non-Medical): No  Physical Activity: Insufficiently Active (01/04/2019)   Exercise Vital Sign    Days of Exercise per Week: 3 days    Minutes of Exercise per Session: 30 min  Stress: Unknown (01/04/2019)   [place]    Feeling of Stress : Patient refused  Social Connections: Unknown (01/04/2019)   Social Connection and Isolation Panel [NHANES]    Frequency of Communication with Friends and Family: Three times a week    Frequency of Social Gatherings with Friends and Family: Twice a week    Attends Religious Services: More than 4 times per year    Active Member of [employer] or Organizations: Patient refused    Attends Archivist Meetings: Patient refused    Marital Status: Married     Assessment: Nichole Perkins is here today to follow up for her latent TB infection. She had a positive quantiFERON test back in 2017 and completed 6 out of 12 weeks of treatment. Chest Xray was negative so Dr. West Bali elected to retreat her with rifapentine + isoniazid + pyridoxine x 12 weekly doses to complete treatment. Advised to take rifapentine 900 mg (6 tablets) once weekly WITH food x 12 weeks, isoniazid 900 mg (3  tablets) once weekly WITHOUT food x 12 weeks, and pyridoxine 50 mg (1 tablet) once weekly x 12 weeks. Advised to take both on the same day each week but that she can split up and take one in the AM and one in the PM if she desires. She thinks she will take the rifapentine with dinner and then 2 hours later, take the isoniazid and pyridoxine. Advised that it may cause nausea and to let us know if she needs zofran sent in. [facility] [address]. She will call if she has any issues or trouble obtaining from pharmacy.   Plan: - Start rifapentine 900 mg PO once weekly x 12 weeks - Start isoniazid 900 mg PO once weekly x 12 weeks - Start pyridoxine 50 mg  PO once weekly x 12 weeks - Call with any issues or questions  Nichole Perkins, PharmD, BCIDP, AAHIVP, CPP Clinical Pharmacist Practitioner Infectious Diseases [facility] 05/11/2022, 11:49 AM

## 2022-06-22 ENCOUNTER — Other Ambulatory Visit: Payer: Self-pay

## 2022-06-22 ENCOUNTER — Encounter: Payer: Self-pay | Admitting: Infectious Diseases

## 2022-06-22 ENCOUNTER — Ambulatory Visit (INDEPENDENT_AMBULATORY_CARE_PROVIDER_SITE_OTHER): Payer: 59 | Admitting: Infectious Diseases

## 2022-06-22 VITALS — BP 135/81 | HR 68 | Temp 98.7°F | Resp 16 | Ht 65.0 in | Wt 216.8 lb

## 2022-06-22 DIAGNOSIS — Z79899 Other long term (current) drug therapy: Secondary | ICD-10-CM | POA: Diagnosis not present

## 2022-06-22 DIAGNOSIS — R7612 Nonspecific reaction to cell mediated immunity measurement of gamma interferon antigen response without active tuberculosis: Secondary | ICD-10-CM | POA: Diagnosis not present

## 2022-06-22 NOTE — Progress Notes (Signed)
[facility]                                      [address], [place], [place], [address]                                               Phn. [phone]; Fax: [phone]  Date: 06/22/22 Reason for Visit: Fu for latent TB  HPI: Nichole Perkins is a 37 y.o.old female with no known past medical history who is referred for positive QuantiFERON test.  States she is originally from [place] and moved to [place] in 2017 when she was initially tested positive for QuantiFERON.  She had a negative tuberculin skin test back in [place].  She was referred to [facility] for positive QuantiFERON sometime back in 2020/2021 where she completed almost 6 weeks of treatment but then did not complete the remainder of the treatment.  Tells me it was multiple pills to take every week but does not know the name of the pills.   Denies cough more than 2 weeks, night sweats, SOB, hemoptysis,  loss of appetite and loss of weight. Risk factor for reactivation: Healthcare exposure.  Denies close contact with TB patients or family history of TB.  Denies any prior incarceration, living in congregate settings, IVDU or being homeless  She has no complaints today whatsoever and would like to be treated for latent TB.  2/26  Taking Isonizaid/rifapentine weekly as instructed. She however is not taking pyridoxine as she was not given it by the pharmacy. She started taking meds Thursday of same week after seeing Cassie on 1/15 ( likely started on 1/18) and has completed 6 weeks so far. Denies missing doses. Denies nausea, vomiting, abdominal pain and diarrhea. Denies jaundice, loss of appetite, numbness and paresthesia of her extremities. No new medications.  No concerns otherwise.   ROS: Denies yellowish discoloration of sclera and skin, abdominal pain/distension, hematemesis.            Denies fever, chills, nightsweats, nausea, vomiting,  diarrhea, constipation, weight loss, recent hospitalizations, rashes, joint complaints, shortness of breath, chest pain, headaches, dysuria .  Current Outpatient Medications on File Prior to Visit  Medication Sig Dispense Refill   isoniazid (NYDRAZID) 300 MG tablet Take 3 tablets (900 mg total) by mouth once a week. Take WITHOUT food. 1 hour before or 2 hours after a meal. 12 tablet 2   Multiple Vitamin (MULTI-DAY PO) Take by mouth.     pyridOXINE (VITAMIN B6) 50 MG tablet Take 1 tablet (50 mg total) by mouth once a week. 4 tablet 2   Rifapentine 150 MG TABS Take 6 tablets (900 mg total) by mouth once a week. Take WITH food. 24 tablet 2   Vitamin D, Ergocalciferol, (DRISDOL) 1.25 MG (50000 UNIT) CAPS capsule Take 1 capsule (50,000 Units total) by mouth every 7 (seven) days. 7 capsule 12   No current facility-administered medications on file prior to visit.   No Known Allergies  No past medical history on file.  Past Surgical History:  Procedure Laterality Date   CESAREAN SECTION     CESAREAN SECTION N/A 12/01/2016   Procedure: CESAREAN SECTION;  Surgeon: Truett Mainland, DO;  Location: [facility];  Service:  Obstetrics;  Laterality: N/A;   CESAREAN SECTION     WRIST SURGERY Left 2010   growth removed   Social History   Socioeconomic History   Marital status: Married    Spouse name: Not on file   Number of children: 3   Years of education: Not on file   Highest education level: Not on file  Occupational History   Not on file  Tobacco Use   Smoking status: Never   Smokeless tobacco: Never  Vaping Use   Vaping Use: Never used  Substance and Sexual Activity   Alcohol use: Not Currently    Comment: occ   Drug use: No   Sexual activity: Yes    Birth control/protection: None, I.U.D.  Other Topics Concern   Not on file  Social History Narrative   Not on file   Social Determinants of Health   Financial Resource Strain: Low Risk  (01/04/2019)   Overall Financial  Resource Strain (CARDIA)    Difficulty of Paying Living Expenses: Not hard at all  Food Insecurity: No Food Insecurity (01/04/2019)   Hunger Vital Sign    Worried About Running Out of Food in the Last Year: Never true    Ran Out of Food in the Last Year: Never true  Transportation Needs: No Transportation Needs (01/04/2019)   PRAPARE - Hydrologist (Medical): No    Lack of Transportation (Non-Medical): No  Physical Activity: Insufficiently Active (01/04/2019)   Exercise Vital Sign    Days of Exercise per Week: 3 days    Minutes of Exercise per Session: 30 min  Stress: Unknown (01/04/2019)   [place]    Feeling of Stress : Patient refused  Social Connections: Unknown (01/04/2019)   Social Connection and Isolation Panel [NHANES]    Frequency of Communication with Friends and Family: Three times a week    Frequency of Social Gatherings with Friends and Family: Twice a week    Attends Religious Services: More than 4 times per year    Active Member of Clubs or Organizations: Patient refused    Attends Archivist Meetings: Patient refused    Marital Status: Married  Human resources officer Violence: Not At Risk (01/04/2019)   Humiliation, Afraid, Rape, and Kick questionnaire    Fear of Current or Ex-Partner: No    Emotionally Abused: No    Physically Abused: No    Sexually Abused: No   Family History  Problem Relation Age of Onset   Hypertension Mother    Diabetes Father    Hypertension Father    Hypertension Brother    Vitals  BP 135/81   Pulse 68   Temp 98.7 F (37.1 C) (Oral)   Resp 16   Ht '5\' 5"'$  (1.651 m)   Wt 216 lb 12.8 oz (98.3 kg)   SpO2 100%   BMI 36.08 kg/m   Gen: Alert and oriented x 3, no acute distress, obese  HEENT: [place]/AT, no scleral icterus, no pale conjunctivae, hearing normal, oral mucosa moist Neck: Supple Cardio: Regular rate and rhythm Resp: CTAB GI: Soft,  nontender, nondistended GU: Musc: Extremities: No pedal  edema Skin: No rashes, lesions, or ecchymoses Neuro: No focal deficits; awake, alert and oriented  Psych: Calm, cooperative   Laboratory     Latest Ref Rng & Units 11/27/2021    1:26 PM 10/02/2020    8:49 AM 07/07/2019   10:05 AM  CBC  WBC  4.0 - 10.5 K/uL 4.5  4.6  3.8   Hemoglobin 12.0 - 15.0 g/dL 12.5  12.8  12.4   Hematocrit 36.0 - 46.0 % 37.5  38.5  36.8   Platelets 150.0 - 400.0 K/uL 249.0  237.0        Latest Ref Rng & Units 05/05/2022    9:39 AM 11/27/2021    1:26 PM 10/02/2020    8:49 AM  CMP  Glucose 65 - 99 mg/dL 113  125  132   BUN 7 - 25 mg/dL '10  10  11   '$ Creatinine 0.50 - 0.97 mg/dL 0.72  0.71  0.70   Sodium 135 - 146 mmol/L 137  136  137   Potassium 3.5 - 5.3 mmol/L 4.4  3.9  4.4   Chloride 98 - 110 mmol/L 103  102  104   CO2 20 - 32 mmol/L '25  26  24   '$ Calcium 8.6 - 10.2 mg/dL 9.5  9.3  8.9   Total Protein 6.1 - 8.1 g/dL 7.3  7.4  7.0   Total Bilirubin 0.2 - 1.2 mg/dL 0.4  0.3  0.3   Alkaline Phos 39 - 117 U/L  72  68   AST 10 - 30 U/L 15  18  35   ALT 6 - 29 U/L 16  19  52    Pertinent Imaging No results found.  Assessment/Plan: # Positive Quantiferon/Latent TB  # Medication Monitoring   Complete 12 weeks of Isoniazid and Rifapentine Instructed to get Vi6 B 6 OTC as [facility] not [facility]  Labs today  Discussed side effects of medications to seek immediate attention  Fu in 6 weeks, end of tx.   I have personally spent 41  minutes involved in face-to-face and non-face-to-face activities for this patient on the day of the visit. Professional time spent includes the following activities: Preparing to see the patient (review of tests), Obtaining and/or reviewing separately obtained history (admission/discharge record), Performing a medically appropriate examination and/or evaluation , Ordering medications/tests/procedures, referring and communicating with other health care professionals,  Documenting clinical information in the EMR, Independently interpreting results (not separately reported), Communicating results to the patient/family/caregiver, Counseling and educating the patient/family/caregiver and Care coordination (not separately reported).    Patients questions were addressed and answered.    Electronically signed by:  Rosiland Oz, MD Infectious Diseases  Office phone [phone] Fax no. [phone]

## 2022-06-23 LAB — COMPREHENSIVE METABOLIC PANEL
AG Ratio: 1.4 (calc) (ref 1.0–2.5)
ALT: 19 U/L (ref 6–29)
AST: 20 U/L (ref 10–30)
Albumin: 4.4 g/dL (ref 3.6–5.1)
Alkaline phosphatase (APISO): 76 U/L (ref 31–125)
BUN: 7 mg/dL (ref 7–25)
CO2: 27 mmol/L (ref 20–32)
Calcium: 9.4 mg/dL (ref 8.6–10.2)
Chloride: 105 mmol/L (ref 98–110)
Creat: 0.74 mg/dL (ref 0.50–0.97)
Globulin: 3.2 g/dL (calc) (ref 1.9–3.7)
Glucose, Bld: 103 mg/dL — ABNORMAL HIGH (ref 65–99)
Potassium: 4.4 mmol/L (ref 3.5–5.3)
Sodium: 139 mmol/L (ref 135–146)
Total Bilirubin: 0.3 mg/dL (ref 0.2–1.2)
Total Protein: 7.6 g/dL (ref 6.1–8.1)

## 2023-05-03 ENCOUNTER — Telehealth: Payer: Self-pay

## 2023-05-03 NOTE — Telephone Encounter (Signed)
 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 Would like to know if provider can write letter on her behalf stating she is has completed treatment. Would also like to know if she would need xray yearly done to show she does not have TB. Lorenda CHRISTELLA Code, RMA

## 2023-05-03 NOTE — Telephone Encounter (Signed)
 She has not seen [place] since starting treatment in Feb 2024. Please offer her an appointment to address all her concerns. Thanks.

## 2023-05-03 NOTE — Telephone Encounter (Signed)
 Pt scheduled to come in tomorrow. Juanita Laster, RMA

## 2023-05-04 ENCOUNTER — Other Ambulatory Visit: Payer: Self-pay

## 2023-05-04 ENCOUNTER — Encounter: Payer: Self-pay | Admitting: Infectious Diseases

## 2023-05-04 ENCOUNTER — Ambulatory Visit (INDEPENDENT_AMBULATORY_CARE_PROVIDER_SITE_OTHER): Payer: Self-pay | Admitting: Infectious Diseases

## 2023-05-04 VITALS — Temp 98.2°F | Wt 222.6 lb

## 2023-05-04 DIAGNOSIS — Z79899 Other long term (current) drug therapy: Secondary | ICD-10-CM

## 2023-05-04 DIAGNOSIS — R7612 Nonspecific reaction to cell mediated immunity measurement of gamma interferon antigen response without active tuberculosis: Secondary | ICD-10-CM

## 2023-05-04 DIAGNOSIS — E669 Obesity, unspecified: Secondary | ICD-10-CM

## 2023-05-04 NOTE — Progress Notes (Signed)
 [facility]                                      [address], [place], [place], [address]                                               Phn. [phone]; Fax: [phone]  Date: 05/04/2023 Reason for Visit: Fu for latent TB  HPI: Nichole Perkins is a 38 y.o.old female with no known past medical history who is referred for positive QuantiFERON test.  States she is originally from [place] and moved to [place]  in 2017 when she was initially tested positive for QuantiFERON.  She had a negative tuberculin skin test back in [place].  She was referred to [facility] department for positive QuantiFERON sometime back in 2020/2021 where she completed almost 6 weeks of treatment but then did not complete the remainder of the treatment.  Tells me it was multiple pills to take every week but does not know the name of the pills.   Denies cough more than 2 weeks, night sweats, SOB, hemoptysis,  loss of appetite and loss of weight. Risk factor for reactivation: Healthcare exposure.  Denies close contact with TB patients or family history of TB.  Denies any prior incarceration, living in congregate settings, IVDU or being homeless  She has no complaints today whatsoever and would like to be treated for latent TB.  2/26  Taking Isonizaid/rifapentine  weekly as instructed. She however is not taking pyridoxine as she was not given it by the pharmacy. She started taking meds Thursday of same week after seeing Cassie on 1/15 ( likely started on 1/18) and has completed 6 weeks so far. Denies missing doses. Denies nausea, vomiting, abdominal pain and diarrhea. Denies jaundice, loss of appetite, numbness and paresthesia of her extremities. No new medications.  No concerns otherwise.   1/7 Reports completion of medications as intsructed, no missed doses. She is still in nursing school. Reports gaining weight due to a lot of snacking as well as due  to holidays. She was asking if she needed to get a repeat Chest xray after tx completion and discussed no need unless there are signs and symptoms to suggest TB. She does not have any signs and symptoms of TB currently. She would also like to check a CMP today. No other concerns.   ROS: Denies yellowish discoloration of sclera and skin, abdominal pain/distension, hematemesis.            Denies fever, chills, nightsweats, nausea, vomiting, diarrhea, constipation, weight loss, recent hospitalizations, rashes, joint complaints, shortness of breath, chest pain, headaches, dysuria .  Current Outpatient Medications on File Prior to Visit  Medication Sig Dispense Refill   Multiple Vitamin (MULTI-DAY PO) Take by mouth.     Vitamin D , Ergocalciferol , (DRISDOL ) 1.25 MG (50000 UNIT) CAPS capsule Take 1 capsule (50,000 Units total) by mouth every 7 (seven) days. (Patient not taking: Reported on 05/04/2023) 7 capsule 12   No current facility-administered medications on file prior to visit.   No Known Allergies  No past medical history on file.  Past Surgical History:  Procedure Laterality Date   CESAREAN SECTION     CESAREAN SECTION N/A 12/01/2016   Procedure: CESAREAN SECTION;  Surgeon: Barbra Lang PARAS, DO;  Location: [facility];  Service: Obstetrics;  Laterality: N/A;   CESAREAN SECTION     WRIST SURGERY Left 2010   growth removed   Social History   Socioeconomic History   Marital status: Married    Spouse name: Not on file   Number of children: 3   Years of education: Not on file   Highest education level: Not on file  Occupational History   Not on file  Tobacco Use   Smoking status: Never   Smokeless tobacco: Never  Vaping Use   Vaping status: Never Used  Substance and Sexual Activity   Alcohol use: Not Currently    Comment: occ   Drug use: No   Sexual activity: Yes    Birth control/protection: None, I.U.D.  Other Topics Concern   Not on file  Social History Narrative    Not on file   Social Drivers of Health   Financial Resource Strain: Low Risk  (01/04/2019)   Overall Financial Resource Strain (CARDIA)    Difficulty of Paying Living Expenses: Not hard at all  Food Insecurity: No Food Insecurity (01/04/2019)   Hunger Vital Sign    Worried About Running Out of Food in the Last Year: Never true    Ran Out of Food in the Last Year: Never true  Transportation Needs: No Transportation Needs (01/04/2019)   PRAPARE - Administrator, Civil Service (Medical): No    Lack of Transportation (Non-Medical): No  Physical Activity: Insufficiently Active (01/04/2019)   Exercise Vital Sign    Days of Exercise per Week: 3 days    Minutes of Exercise per Session: 30 min  Stress: Unknown (01/04/2019)   [employer] of Occupational Health - Occupational Stress Questionnaire    Feeling of Stress : Patient declined  Social Connections: Unknown (01/04/2019)   Social Connection and Isolation Panel [NHANES]    Frequency of Communication with Friends and Family: Three times a week    Frequency of Social Gatherings with Friends and Family: Twice a week    Attends Religious Services: More than 4 times per year    Active Member of [employer] or Organizations: Patient declined    Attends Banker Meetings: Patient declined    Marital Status: Married  Catering Manager Violence: Not At Risk (01/04/2019)   Humiliation, Afraid, Rape, and Kick questionnaire    Fear of Current or Ex-Partner: No    Emotionally Abused: No    Physically Abused: No    Sexually Abused: No   Family History  Problem Relation Age of Onset   Hypertension Mother    Diabetes Father    Hypertension Father    Hypertension Brother    Vitals  Temp 98.2 F (36.8 C) (Oral)   Wt 222 lb 9.6 oz (101 kg)   BMI 37.04 kg/m    Gen: Alert and oriented x 3, no acute distress, morbidly obese  HEENT: [place]/AT, no scleral icterus, no pale conjunctivae, hearing normal, oral mucosa moist Neck:  Supple Cardio: Regular rate and rhythm Resp: CTAB GI: nondistended GU: Musc: Extremities: No pedal  edema Skin: No rashes Neuro: No focal deficits; awake, alert and oriented  Psych: Calm, cooperative  Laboratory     Latest Ref Rng & Units 11/27/2021    1:26 PM 10/02/2020    8:49 AM 07/07/2019   10:05 AM  CBC  WBC 4.0 - 10.5 K/uL 4.5  4.6  3.8   Hemoglobin 12.0 - 15.0  g/dL 87.4  87.1  87.5   Hematocrit 36.0 - 46.0 % 37.5  38.5  36.8   Platelets 150.0 - 400.0 K/uL 249.0  237.0        Latest Ref Rng & Units 06/22/2022    9:48 AM 05/05/2022    9:39 AM 11/27/2021    1:26 PM  CMP  Glucose 65 - 99 mg/dL 896  886  874   BUN 7 - 25 mg/dL 7  10  10    Creatinine 0.50 - 0.97 mg/dL 9.25  9.27  9.28   Sodium 135 - 146 mmol/L 139  137  136   Potassium 3.5 - 5.3 mmol/L 4.4  4.4  3.9   Chloride 98 - 110 mmol/L 105  103  102   CO2 20 - 32 mmol/L 27  25  26    Calcium 8.6 - 10.2 mg/dL 9.4  9.5  9.3   Total Protein 6.1 - 8.1 g/dL 7.6  7.3  7.4   Total Bilirubin 0.2 - 1.2 mg/dL 0.3  0.4  0.3   Alkaline Phos 39 - 117 U/L   72   AST 10 - 30 U/L 20  15  18    ALT 6 - 29 U/L 19  16  19     Pertinent Imaging No results found.  Assessment/Plan: # Positive Quantiferon/Latent TB  # Medication Monitoring  - s/p completion of 12 weeks of Isoniazid  and Rifapentine  - Letter provided regarding completion of tx. Discussed that she will test positive for quantiferon in the future and will need to submit letter of completion of latent TB treatment if required by school/work.  - CMP per patient's request  - Fu as needed.   # Morbid Obesity  - will benefit from weight loss  I have personally spent 30 minutes involved in face-to-face and non-face-to-face activities for this patient on the day of the visit. Professional time spent includes the following activities: Preparing to see the patient (review of tests), Obtaining and/or reviewing separately obtained history (admission/discharge record), Performing a  medically appropriate examination and/or evaluation , Ordering medications/tests/procedures, referring and communicating with other health care professionals, Documenting clinical information in the EMR, Independently interpreting results (not separately reported), Communicating results to the patient/family/caregiver, Counseling and educating the patient/family/caregiver and Care coordination (not separately reported).    Patients questions were addressed and answered.   Electronically signed by:  Annalee Orem, MD Infectious Diseases  Office phone [phone] Fax no. [phone]

## 2023-05-05 ENCOUNTER — Telehealth: Payer: Self-pay

## 2023-05-05 LAB — COMPREHENSIVE METABOLIC PANEL
AG Ratio: 1.3 (calc) (ref 1.0–2.5)
ALT: 15 U/L (ref 6–29)
AST: 16 U/L (ref 10–30)
Albumin: 4.1 g/dL (ref 3.6–5.1)
Alkaline phosphatase (APISO): 80 U/L (ref 31–125)
BUN: 9 mg/dL (ref 7–25)
CO2: 26 mmol/L (ref 20–32)
Calcium: 9.5 mg/dL (ref 8.6–10.2)
Chloride: 104 mmol/L (ref 98–110)
Creat: 0.76 mg/dL (ref 0.50–0.97)
Globulin: 3.1 g/dL (ref 1.9–3.7)
Glucose, Bld: 110 mg/dL — ABNORMAL HIGH (ref 65–99)
Potassium: 4.7 mmol/L (ref 3.5–5.3)
Sodium: 138 mmol/L (ref 135–146)
Total Bilirubin: 0.4 mg/dL (ref 0.2–1.2)
Total Protein: 7.2 g/dL (ref 6.1–8.1)

## 2023-05-05 NOTE — Telephone Encounter (Signed)
 Patient advised labs look good.  Patient had no questions Nichole Perkins [name], CMA

## 2023-05-05 NOTE — Telephone Encounter (Signed)
-----   Message from Victoriano Lain sent at 05/05/2023  3:51 PM EST ----- Please let her know her lab work is unremarkable including liver enzymes. Nothing to do.

## 2023-05-18 ENCOUNTER — Ambulatory Visit (HOSPITAL_BASED_OUTPATIENT_CLINIC_OR_DEPARTMENT_OTHER)
Admission: RE | Admit: 2023-05-18 | Discharge: 2023-05-18 | Disposition: A | Discharge status: Home / Self Care | Payer: Self-pay | Source: Ambulatory Visit | Attending: Urgent Care | Admitting: Urgent Care

## 2023-05-18 ENCOUNTER — Telehealth: Payer: Self-pay

## 2023-05-18 ENCOUNTER — Encounter: Payer: Self-pay | Admitting: Urgent Care

## 2023-05-18 ENCOUNTER — Ambulatory Visit (INDEPENDENT_AMBULATORY_CARE_PROVIDER_SITE_OTHER): Payer: Self-pay | Admitting: Urgent Care

## 2023-05-18 ENCOUNTER — Other Ambulatory Visit (HOSPITAL_COMMUNITY)
Admission: RE | Admit: 2023-05-18 | Discharge: 2023-05-18 | Disposition: A | Discharge status: Home / Self Care | Payer: Self-pay | Source: Ambulatory Visit | Attending: Urgent Care | Admitting: Urgent Care

## 2023-05-18 ENCOUNTER — Encounter: Payer: Self-pay | Admitting: Infectious Diseases

## 2023-05-18 VITALS — BP 108/74 | HR 69 | Ht 65.0 in | Wt 220.0 lb

## 2023-05-18 DIAGNOSIS — Z8611 Personal history of tuberculosis: Secondary | ICD-10-CM

## 2023-05-18 DIAGNOSIS — R7309 Other abnormal glucose: Secondary | ICD-10-CM | POA: Insufficient documentation

## 2023-05-18 DIAGNOSIS — E559 Vitamin D deficiency, unspecified: Secondary | ICD-10-CM | POA: Insufficient documentation

## 2023-05-18 DIAGNOSIS — Z124 Encounter for screening for malignant neoplasm of cervix: Secondary | ICD-10-CM | POA: Insufficient documentation

## 2023-05-18 DIAGNOSIS — Z0000 Encounter for general adult medical examination without abnormal findings: Secondary | ICD-10-CM | POA: Insufficient documentation

## 2023-05-18 LAB — CBC WITH DIFFERENTIAL/PLATELET
Basophils Absolute: 0 10*3/uL (ref 0.0–0.1)
Basophils Relative: 0.4 % (ref 0.0–3.0)
Eosinophils Absolute: 0.3 10*3/uL (ref 0.0–0.7)
Eosinophils Relative: 4.9 % (ref 0.0–5.0)
HCT: 38.7 % (ref 36.0–46.0)
Hemoglobin: 12.8 g/dL (ref 12.0–15.0)
Lymphocytes Relative: 40 % (ref 12.0–46.0)
Lymphs Abs: 2.1 10*3/uL (ref 0.7–4.0)
MCHC: 32.9 g/dL (ref 30.0–36.0)
MCV: 91.6 fL (ref 78.0–100.0)
Monocytes Absolute: 0.3 10*3/uL (ref 0.1–1.0)
Monocytes Relative: 5.6 % (ref 3.0–12.0)
Neutro Abs: 2.6 10*3/uL (ref 1.4–7.7)
Neutrophils Relative %: 49.1 % (ref 43.0–77.0)
Platelets: 287 10*3/uL (ref 150.0–400.0)
RBC: 4.23 Mil/uL (ref 3.87–5.11)
RDW: 13.5 % (ref 11.5–15.5)
WBC: 5.3 10*3/uL (ref 4.0–10.5)

## 2023-05-18 LAB — LIPID PANEL
Cholesterol: 145 mg/dL (ref 0–200)
HDL: 40 mg/dL (ref >39.00)
LDL Cholesterol: 85 mg/dL (ref 0–99)
NonHDL: 104.97
Total CHOL/HDL Ratio: 4
Triglycerides: 102 mg/dL (ref 0.0–149.0)
VLDL: 20.4 mg/dL (ref 0.0–40.0)

## 2023-05-18 LAB — HEMOGLOBIN A1C: Hgb A1c MFr Bld: 7 % — ABNORMAL HIGH (ref 4.6–6.5)

## 2023-05-18 LAB — TSH: TSH: 1.13 u[IU]/mL (ref 0.35–5.50)

## 2023-05-18 NOTE — Progress Notes (Signed)
Annual Wellness Visit     Patient: Nichole Perkins, Female    DOB: 06/18/1985, 38 y.o.   MRN: [id number]  Subjective  Chief Complaint  Patient presents with   Establish Care    New pt est care and possible CPE. 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 Pt would also like a referral for a GYN   Discussed the use of AI software for clinical note transcription with the patient who gave verbal consent to proceed.   Nichole Perkins is a 38 y.o. female who presents today for her Annual Wellness Visit. She reports consuming a general diet. The patient does not participate in regular exercise at present. She generally feels well. She reports sleeping well. She does not have additional problems to discuss today.   History of Present Illness The patient, with a history of latent tuberculosis (TB), underwent treatment approximately a year ago with Rifampin and another unspecified medication for a duration of three months. The patient reports no current symptoms related to TB. The patient also has a history of abnormal glucose levels, with an A1c of 6.5 recorded a year and a half ago.  The patient has not had a Pap smear since the birth of her youngest child, who is now 56 years old. She has never had an abnormal Pap smear. The patient has three children in total, all of whom live with her.  The patient self-rates her health as good, despite acknowledging a lack of exercise and a tendency to overeat. She does not smoke or regularly consume alcohol. The patient's primary form of physical activity is walking and performing household chores.  The patient has gained weight recently and is aware that her BMI is above the recommended range. She has not had routine dental or eye exams. Her last cholesterol panel, conducted in August 2020, was reported as perfect.  She is currently in nursing school and requiring a physical. She is required to have a chest xray  for nursing school due to her hx of latent TB.    Vision:Not within last year  and Dental: No current dental problems and No regular dental care    Patient Active Problem List   Diagnosis Date Noted   History of TB (tuberculosis) 05/18/2023   Abnormal glucose 05/18/2023   Cervical cancer screening 05/18/2023   Vitamin D deficiency 05/18/2023   Adult wellness visit 05/18/2023   Medication management 05/05/2022   Positive QuantiFERON-TB Gold test 05/05/2022   Physical exam 11/27/2021   Obesity (BMI 30-39.9) 11/27/2021   History reviewed. No pertinent past medical history. Past Surgical History:  Procedure Laterality Date   CESAREAN SECTION     CESAREAN SECTION N/A 12/01/2016   Procedure: CESAREAN SECTION;  Surgeon: Levie Heritage, DO;  Location: [facility] BIRTHING SUIT[facility];  Service: Obstetrics;  Laterality: N/A;   CESAREAN SECTION     WRIST SURGERY Left 2010   growth removed   Social History   Tobacco Use   Smoking status: Never   Smokeless tobacco: Never  Vaping Use   Vaping status: Never Used  Substance Use Topics   Alcohol use: Not Currently    Comment: occ   Drug use: No      Medications: Outpatient Medications Prior to Visit  Medication Sig   Multiple Vitamin (MULTI-DAY PO) Take by mouth.   Vitamin D, Ergocalciferol, (DRISDOL) 1.25 MG (50000 UNIT) CAPS capsule Take 1 capsule (50,000 Units total) by  mouth every 7 (seven) days. (Patient not taking: Reported on 05/18/2023)   No facility-administered medications prior to visit.    No Known Allergies  Patient Care Team: Maretta Bees, [place] as PCP - General (Physician Assistant)  ROS Complete 12 point ROS completed with all pertinent positives listed in HPI     Objective  BP 108/74   Pulse 69   Ht 5\' 5"  (1.651 m)   Wt 220 lb (99.8 kg)   SpO2 98%   BMI 36.61 kg/m  BP Readings from Last 3 Encounters:  05/18/23 108/74  06/22/22 135/81  05/05/22 139/83   Wt Readings from Last 3 Encounters:  05/18/23 220 lb  (99.8 kg)  05/04/23 222 lb 9.6 oz (101 kg)  06/22/22 216 lb 12.8 oz (98.3 kg)      Physical Exam Vitals and nursing note reviewed. Exam conducted with a chaperone present.  Constitutional:      General: She is not in acute distress.    Appearance: Normal appearance. She is obese. She is not ill-appearing, toxic-appearing or diaphoretic.  HENT:     Head: Normocephalic and atraumatic.     Right Ear: Tympanic membrane, ear canal and external ear normal. There is no impacted cerumen.     Left Ear: Tympanic membrane, ear canal and external ear normal. There is no impacted cerumen.     Nose: Nose normal.     Mouth/Throat:     Mouth: Mucous membranes are moist.     Pharynx: Oropharynx is clear. No oropharyngeal exudate or posterior oropharyngeal erythema.  Eyes:     General: No scleral icterus.       Right eye: No discharge.        Left eye: No discharge.     Extraocular Movements: Extraocular movements intact.     Pupils: Pupils are equal, round, and reactive to light.  Neck:     Thyroid: No thyroid mass, thyromegaly or thyroid tenderness.  Cardiovascular:     Rate and Rhythm: Normal rate and regular rhythm.     Pulses: Normal pulses.     Heart sounds: No murmur heard. Pulmonary:     Effort: Pulmonary effort is normal. No respiratory distress.     Breath sounds: Normal breath sounds. No stridor. No wheezing or rhonchi.  Abdominal:     General: Abdomen is flat. Bowel sounds are normal. There is no distension.     Palpations: Abdomen is soft. There is no mass.     Tenderness: There is no abdominal tenderness. There is no guarding.  Genitourinary:    Pubic Area: No rash or pubic lice.      Labia:        Right: No rash, tenderness, lesion or injury.        Left: No rash, tenderness, lesion or injury.      Urethra: No prolapse, urethral pain, urethral swelling or urethral lesion.     Vagina: Normal. No vaginal discharge or lesions.     Cervix: Normal. No cervical motion tenderness,  discharge, friability or lesion.     Uterus: Normal.      Adnexa: Right adnexa normal and left adnexa normal.       Right: No mass, tenderness or fullness.         Left: No mass, tenderness or fullness.       Rectum: Normal.  Musculoskeletal:     Cervical back: Normal range of motion and neck supple. No rigidity or tenderness.     Right  lower leg: No edema.     Left lower leg: No edema.  Lymphadenopathy:     Cervical: No cervical adenopathy.     Lower Body: No right inguinal adenopathy. No left inguinal adenopathy.  Skin:    General: Skin is warm and dry.     Coloration: Skin is not jaundiced.     Findings: No bruising, erythema or rash.  Neurological:     General: No focal deficit present.     Mental Status: She is alert and oriented to person, place, and time.     Sensory: No sensory deficit.     Motor: No weakness.  Psychiatric:        Mood and Affect: Mood normal.        Behavior: Behavior normal.       Most recent functional status assessment:     No data to display         Most recent fall risk assessment:    06/22/2022    9:36 AM  Fall Risk   Falls in the past year? 0  Number falls in past yr: 0  Injury with Fall? 0  Risk for fall due to : No Fall Risks  Follow up Falls evaluation completed    Most recent depression screenings:    06/22/2022    9:36 AM 02/12/2022    9:54 AM  PHQ 2/9 Scores  PHQ - 2 Score 0 0  PHQ- 9 Score  1   Most recent cognitive screening:     No data to display         Most recent Audit-C alcohol use screening     No data to display         A score of 3 or more in women, and 4 or more in men indicates increased risk for alcohol abuse, EXCEPT if all of the points are from question 1   Vision/Hearing Screen: No results found.  Last CBC Lab Results  Component Value Date   WBC 4.5 11/27/2021   HGB 12.5 11/27/2021   HCT 37.5 11/27/2021   MCV 90.6 11/27/2021   MCH 30.7 07/07/2019   RDW 13.1 11/27/2021   PLT 249.0  11/27/2021   Last metabolic panel Lab Results  Component Value Date   GLUCOSE 110 (H) 05/04/2023   NA 138 05/04/2023   K 4.7 05/04/2023   CL 104 05/04/2023   CO2 26 05/04/2023   BUN 9 05/04/2023   CREATININE 0.76 05/04/2023   GFR 108.54 11/27/2021   CALCIUM 9.5 05/04/2023   PROT 7.2 05/04/2023   ALBUMIN 4.2 11/27/2021   LABGLOB 2.5 07/07/2019   AGRATIO 1.8 07/07/2019   BILITOT 0.4 05/04/2023   ALKPHOS 72 11/27/2021   AST 16 05/04/2023   ALT 15 05/04/2023   ANIONGAP 8 12/27/2018   Last lipids Lab Results  Component Value Date   CHOL 134 11/27/2021   HDL 42.40 11/27/2021   LDLCALC 78 11/27/2021   TRIG 69.0 11/27/2021   CHOLHDL 3 11/27/2021   Last hemoglobin A1c Lab Results  Component Value Date   HGBA1C 6.5 11/28/2021   Last thyroid functions Lab Results  Component Value Date   TSH 0.88 11/27/2021   Last vitamin D Lab Results  Component Value Date   VD25OH 23.30 (L) 11/27/2021   Last vitamin B12 and Folate No results found for: "VITAMINB12", "FOLATE"    No results found for any visits on 05/18/23.    Assessment & Plan   Annual wellness visit done  today including the all of the following: Reviewed patient's Family Medical History Reviewed and updated list of patient's medical providers Assessment of cognitive impairment was done Assessed patient's functional ability Established a written schedule for health screening services Health Risk Assessent Completed and Reviewed  Exercise Activities and Dietary recommendations  Goals   None     Immunization History  Administered Date(s) Administered   Hepatitis B 07/03/2021   Influenza,inj,Quad PF,6+ Mos 01/04/2019, 03/07/2020   Influenza,trivalent, recombinat, inj, PF 01/23/2022   Influenza-Unspecified 02/16/2023   Tdap 09/10/2016    Health Maintenance  Topic Date Due   Cervical Cancer Screening (HPV/Pap Cotest)  07/08/2021   DTaP/Tdap/Td (2 - Td or Tdap) 09/11/2026   INFLUENZA VACCINE   Completed   Hepatitis C Screening  Completed   HIV Screening  Completed   HPV VACCINES  Aged Out   COVID-19 Vaccine  Discontinued     Discussed health benefits of physical activity, and encouraged her to engage in regular exercise appropriate for her age and condition.    Problem List Items Addressed This Visit       Other   History of TB (tuberculosis)   Latent Tuberculosis Completed treatment with Rifampin and Isoniazid last year. No current symptoms. -Order chest x-ray for follow-up.      Relevant Orders   DG Chest 2 View (Completed)   Abnormal glucose   Abnormal Glucose A1c of 6.5 a year and a half ago, indicating possible diabetes. -Order A1c to reassess glucose control.      Relevant Orders   Hemoglobin A1c   Cervical cancer screening   Cervical Cancer Screening Last Pap smear 6 years ago, no history of abnormal Pap smears. -Perform Pap smear today with high-risk HPV testing.      Relevant Orders   Cytology - PAP   Vitamin D deficiency   Pt defers rechecking due to self pay status, states she completed 12 weeks of 50,000 international units and a prior recheck was already completed      Adult wellness visit - Primary   General Health Maintenance -Order CBC, thyroid test, and lipid panel. (CMP done earlier this month). -Advise patient to continue walking and doing house chores for exercise. -Advise patient to have routine dental and eye exams.      Relevant Orders   CBC with Differential/Platelet   Hemoglobin A1c   TSH   Lipid panel    No follow-ups on file.     Maretta Bees, PA

## 2023-05-18 NOTE — Telephone Encounter (Signed)
Patient called office today requesting chest x ray. States that school is requiring her to have this done regardless of TB history.  Would like to know if order can be placed today so she  can get it done as soon as possible.  Will forward message to provider. Juanita Laster, RMA

## 2023-05-18 NOTE — Assessment & Plan Note (Signed)
Abnormal Glucose A1c of 6.5 a year and a half ago, indicating possible diabetes. -Order A1c to reassess glucose control.

## 2023-05-18 NOTE — Assessment & Plan Note (Signed)
Pt defers rechecking due to self pay status, states she completed 12 weeks of 50,000 international units and a prior recheck was already completed

## 2023-05-18 NOTE — Assessment & Plan Note (Signed)
Cervical Cancer Screening Last Pap smear 6 years ago, no history of abnormal Pap smears. -Perform Pap smear today with high-risk HPV testing.

## 2023-05-18 NOTE — Assessment & Plan Note (Signed)
General Health Maintenance -Order CBC, thyroid test, and lipid panel. (CMP done earlier this month). -Advise patient to continue walking and doing house chores for exercise. -Advise patient to have routine dental and eye exams.

## 2023-05-18 NOTE — Assessment & Plan Note (Signed)
Latent Tuberculosis Completed treatment with Rifampin and Isoniazid last year. No current symptoms. -Order chest x-ray for follow-up.

## 2023-05-18 NOTE — Patient Instructions (Signed)
For your chest xray, Please go to:  [facility] Address: [address], [place], [place] [address] Phone: [phone]  Labs and pap smear results will populate to Mychart upon receipt. We will contact you directly with any abnormalities.

## 2023-05-19 ENCOUNTER — Other Ambulatory Visit: Payer: Self-pay | Admitting: Urgent Care

## 2023-05-19 ENCOUNTER — Encounter: Payer: Self-pay | Admitting: Urgent Care

## 2023-05-19 DIAGNOSIS — E119 Type 2 diabetes mellitus without complications: Secondary | ICD-10-CM

## 2023-05-19 MED ORDER — METFORMIN HCL 500 MG PO TABS: 500.0000 mg | ORAL_TABLET | Freq: Two times a day (BID) | ORAL | 1 refills | Status: AC

## 2023-05-21 LAB — CYTOLOGY - PAP
Chlamydia: NEGATIVE
Comment: NEGATIVE
Comment: NEGATIVE
Comment: NEGATIVE
Comment: NORMAL
Diagnosis: NEGATIVE
High risk HPV: NEGATIVE
Neisseria Gonorrhea: NEGATIVE
Trichomonas: NEGATIVE

## 2023-08-08 IMAGING — CR DG CHEST 2V
2 series · 2 of 2 positions shown · non-contrast
Comparison: 04/10/2019

CLINICAL DATA: Positive TB test.

EXAM:
CHEST - 2 VIEW

[w chest pa]
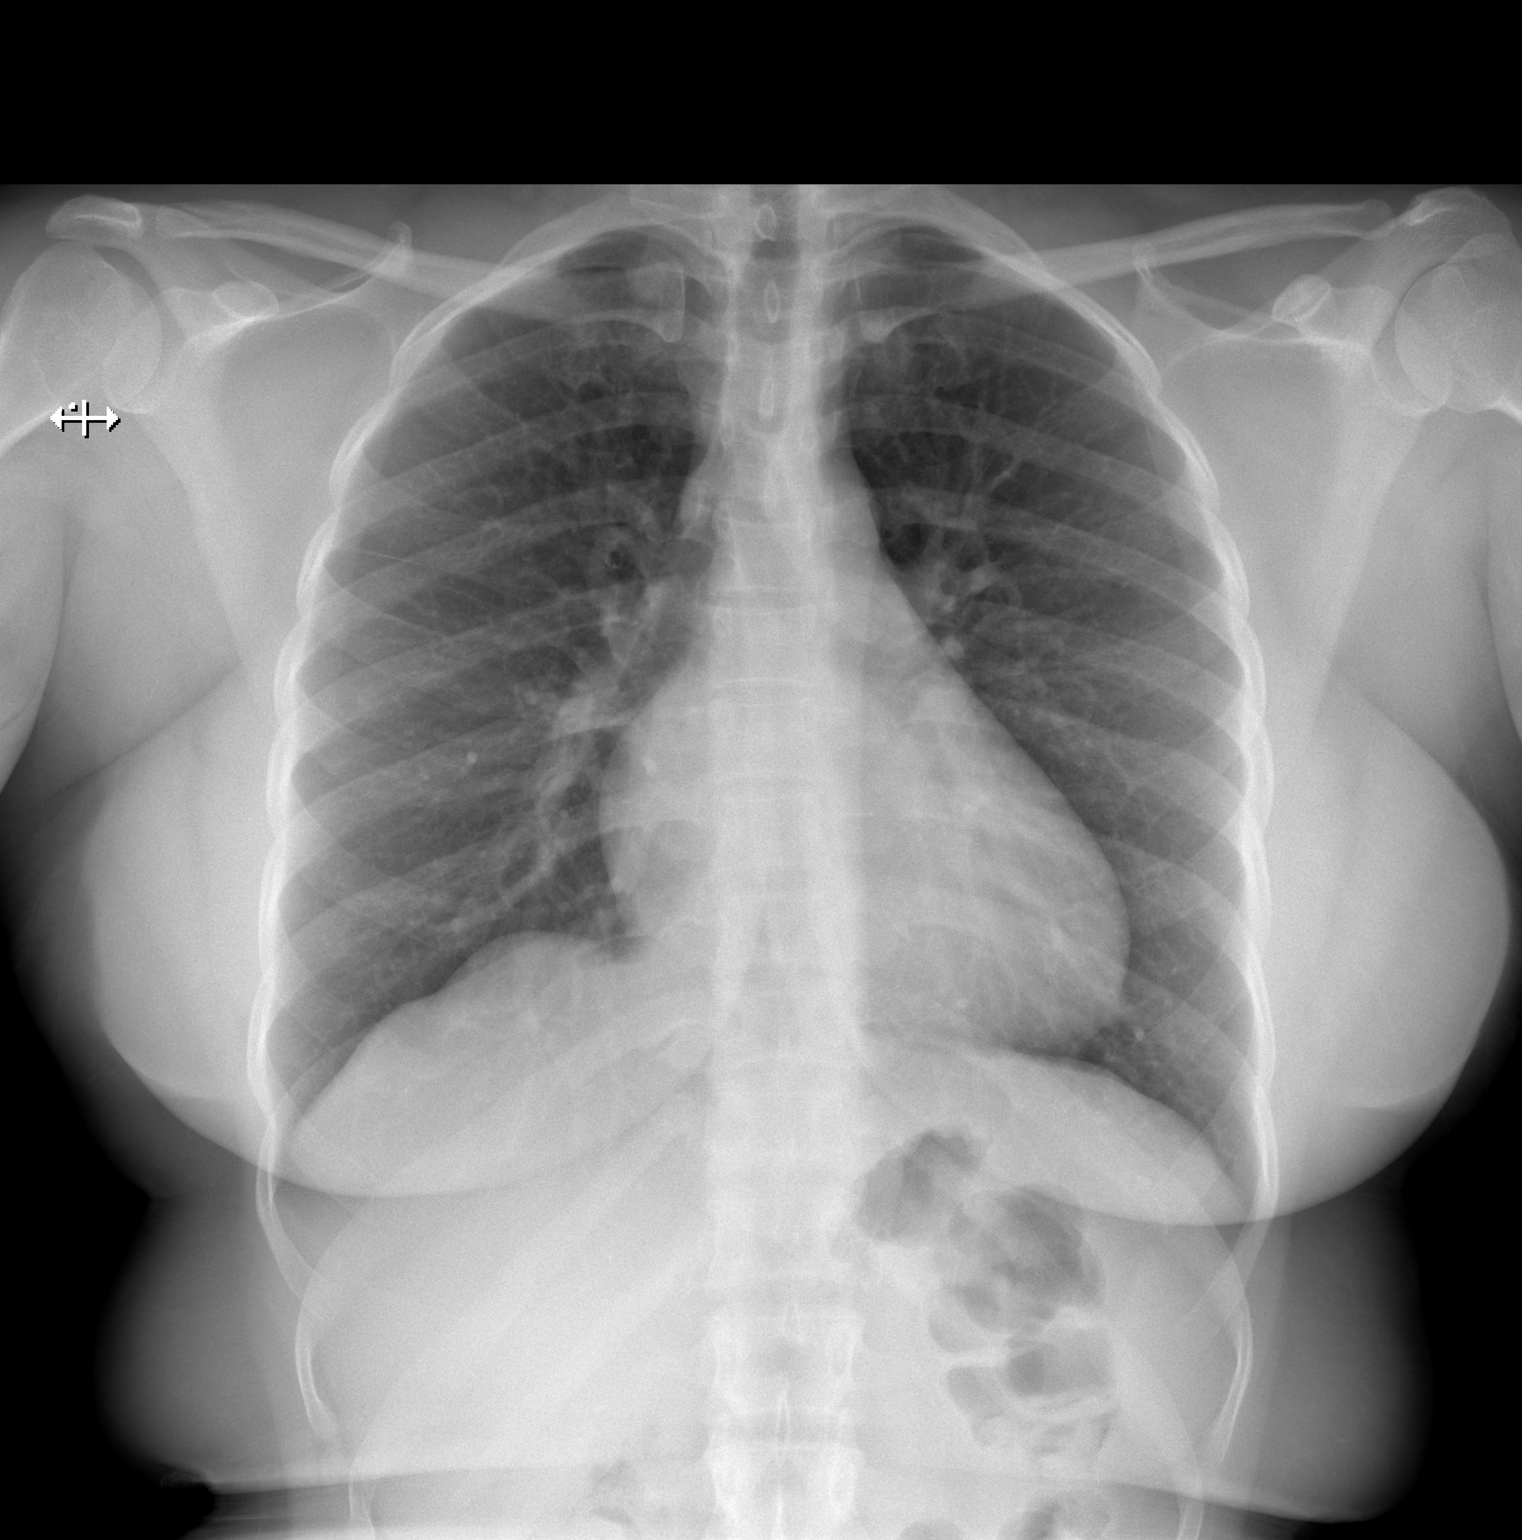

[w chest lat]
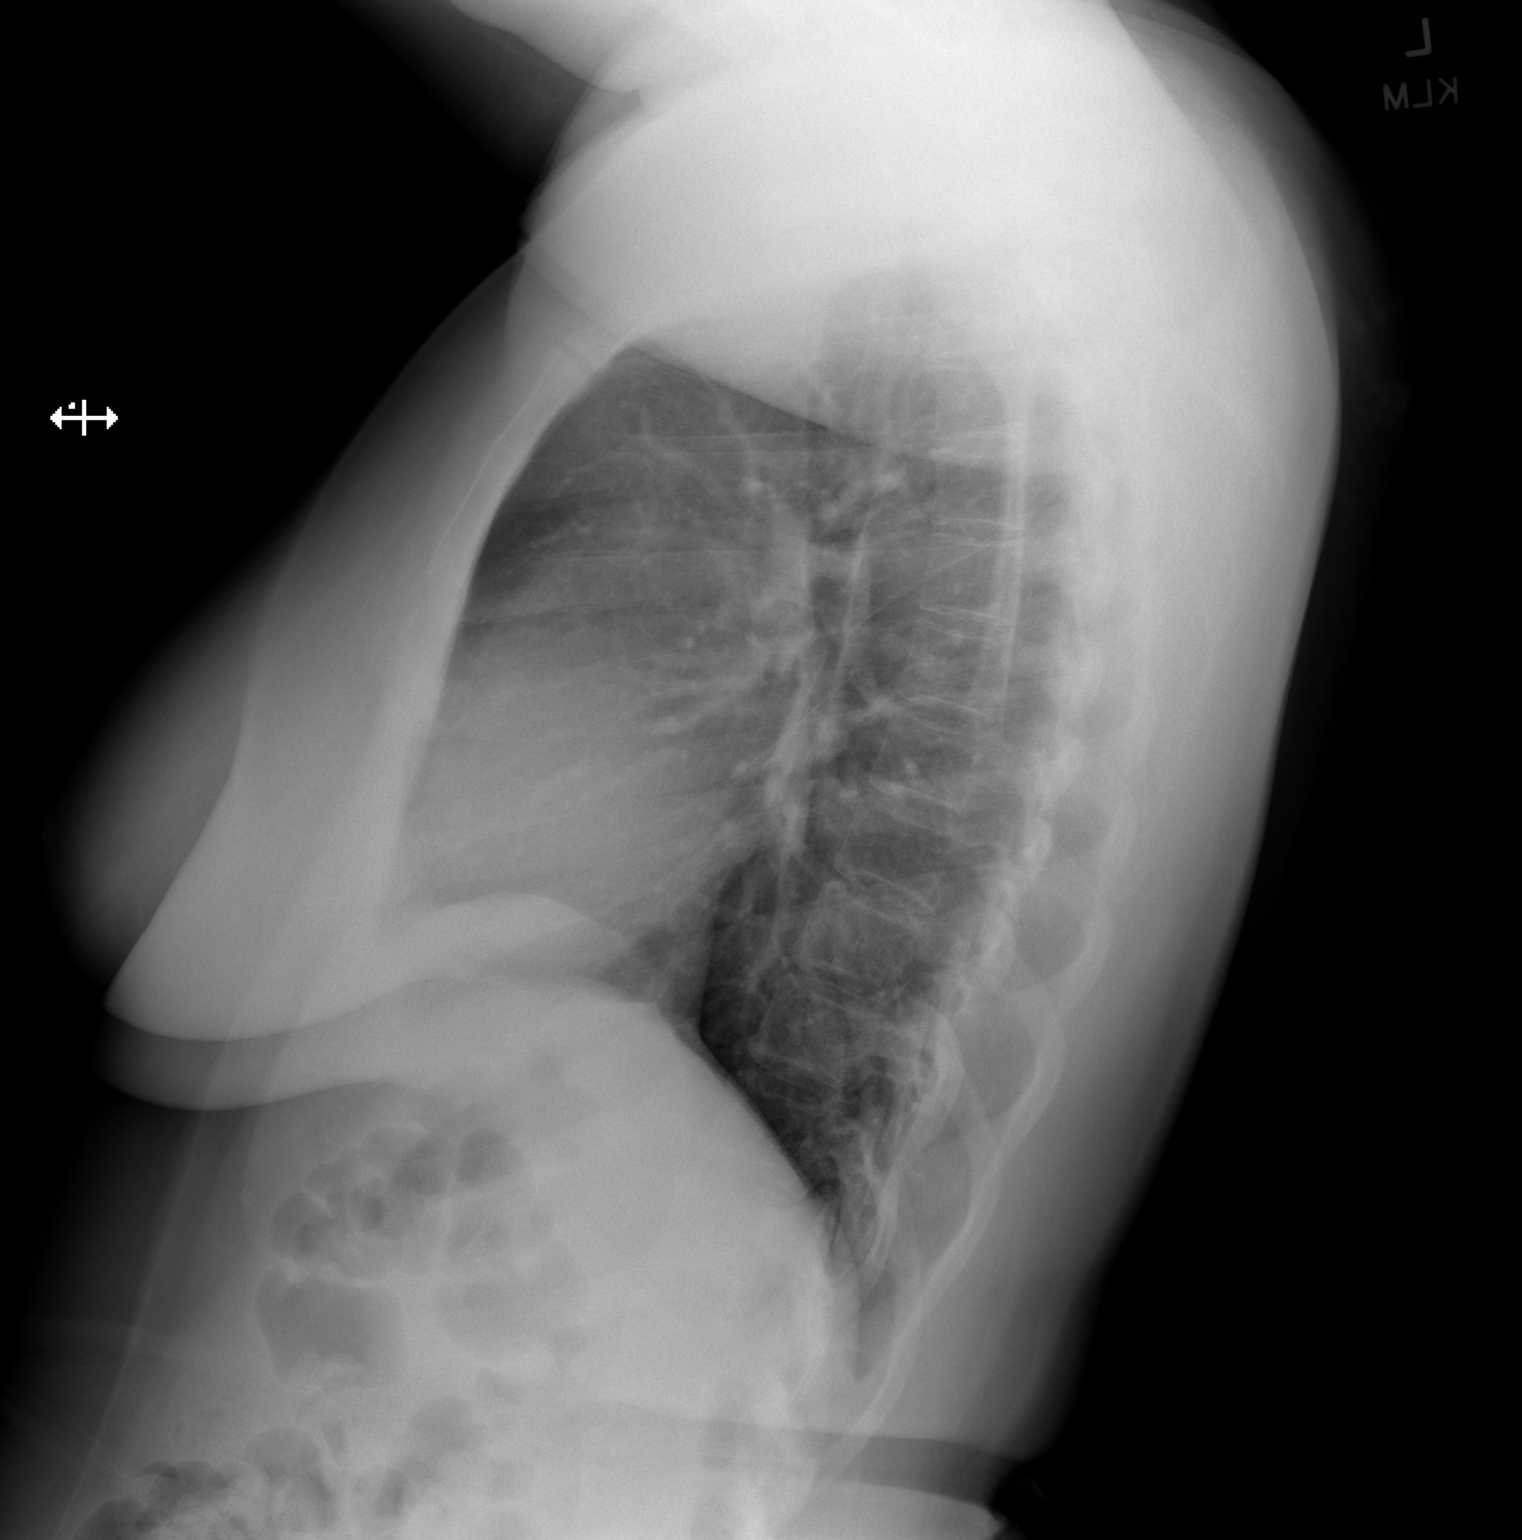

[2 of 2 positions shown; findings below may reference images not displayed]

FINDINGS: The heart size and mediastinal contours are within normal limits.
Both lungs are clear. The visualized skeletal structures are
unremarkable. No pleural effusions.
IMPRESSION: Normal chest examination.
